# Patient Record
Sex: Male | Born: 1980 | Race: White | Hispanic: No | Marital: Single | Smoking: Current every day smoker
Health system: Southern US, Community
[De-identification: ages and names within clinical notes are randomized; demographics above are authoritative.]

---

## 2015-02-04 ENCOUNTER — Emergency Department: Payer: Self-pay | Admitting: Internal Medicine

## 2015-02-14 ENCOUNTER — Ambulatory Visit: Payer: Self-pay | Admitting: Urology

## 2015-02-16 ENCOUNTER — Ambulatory Visit: Payer: Self-pay | Admitting: Urology

## 2015-03-02 ENCOUNTER — Ambulatory Visit: Payer: Self-pay

## 2015-03-17 ENCOUNTER — Ambulatory Visit
Admit: 2015-03-17 | Disposition: A | Discharge status: Home / Self Care | Payer: Self-pay | Attending: Urology | Admitting: Urology

## 2015-04-14 ENCOUNTER — Other Ambulatory Visit: Payer: Self-pay | Admitting: Urology

## 2015-04-14 DIAGNOSIS — N1330 Unspecified hydronephrosis: Secondary | ICD-10-CM

## 2015-04-19 ENCOUNTER — Ambulatory Visit: Payer: Self-pay

## 2015-05-02 ENCOUNTER — Ambulatory Visit: Payer: BLUE CROSS/BLUE SHIELD

## 2015-05-09 ENCOUNTER — Ambulatory Visit: Payer: BLUE CROSS/BLUE SHIELD

## 2015-08-16 IMAGING — CT CT ABD-PELV W/O CM
2 of 4 series · 17 of 46 positions shown, 19 images · non-contrast
Comparison: None.

CLINICAL DATA: Right flank pain for 1 day.

EXAM:
CT ABDOMEN AND PELVIS WITHOUT CONTRAST
TECHNIQUE: Multidetector CT imaging of the abdomen and pelvis was performed
following the standard protocol without IV contrast.

[Series 2: stone standard full · axial · 0.75mm/px · z∈[-252,+188]mm · 14 of 96 slices shown, 16 images]
[im 4/96  soft-tissue]
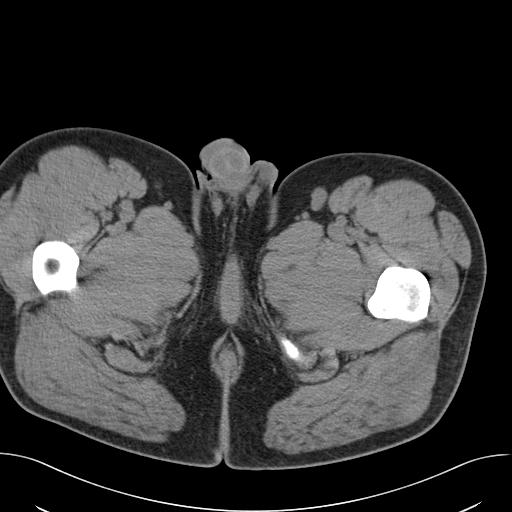
[im 4/96  bone]
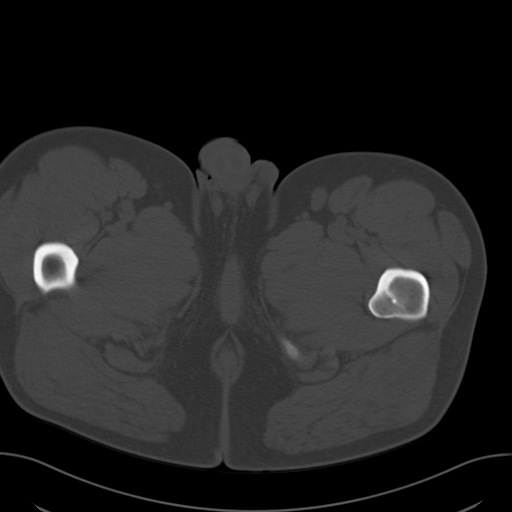
[im 12/96  soft-tissue]
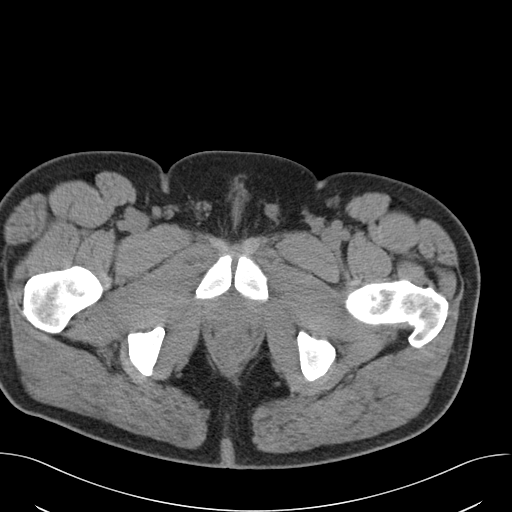
[im 20/96  soft-tissue]
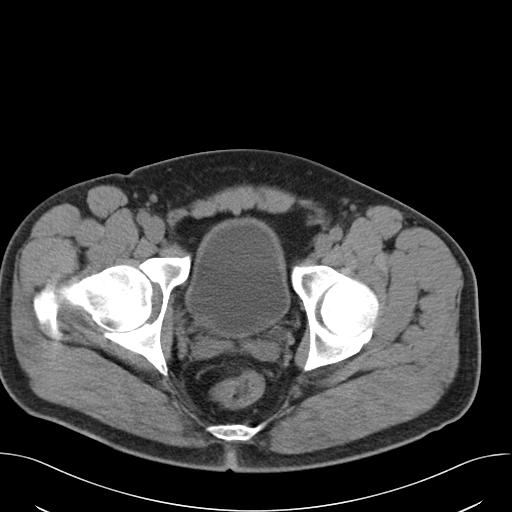
[im 24/96  soft-tissue]
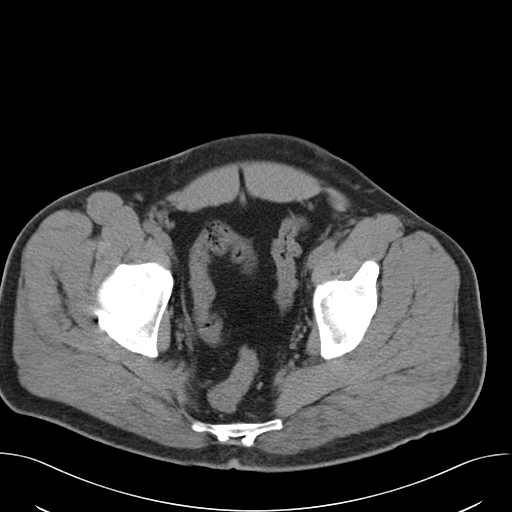
[im 32/96  soft-tissue]
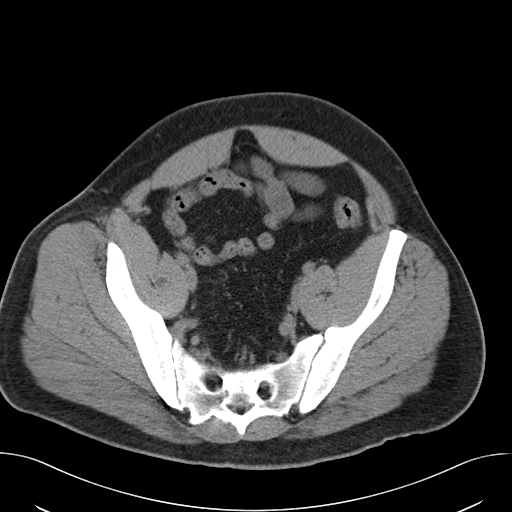
[im 40/96  soft-tissue]
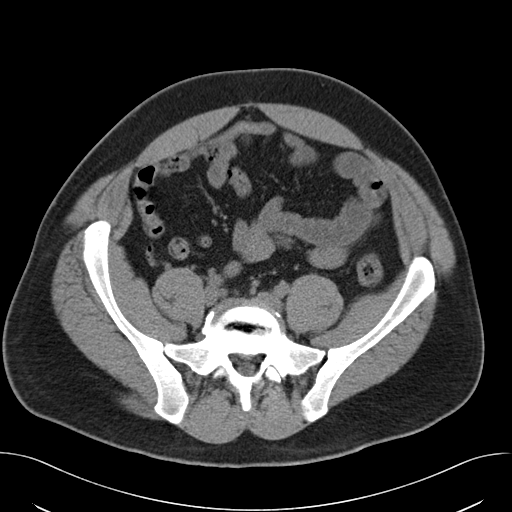
[im 44/96  soft-tissue]
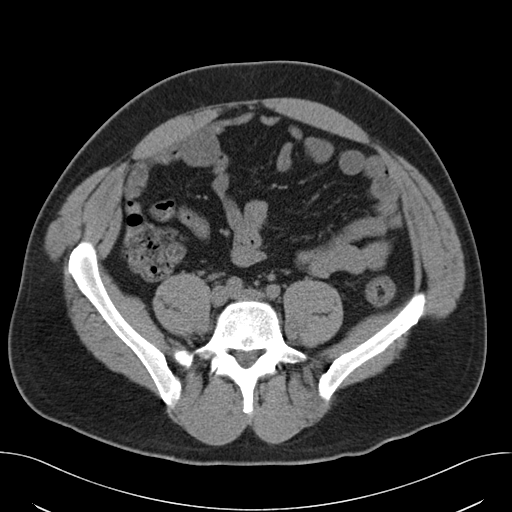
[im 52/96  soft-tissue]
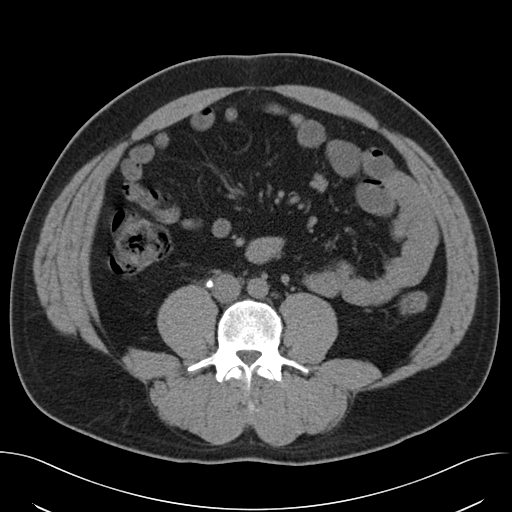
[im 56/96  soft-tissue]
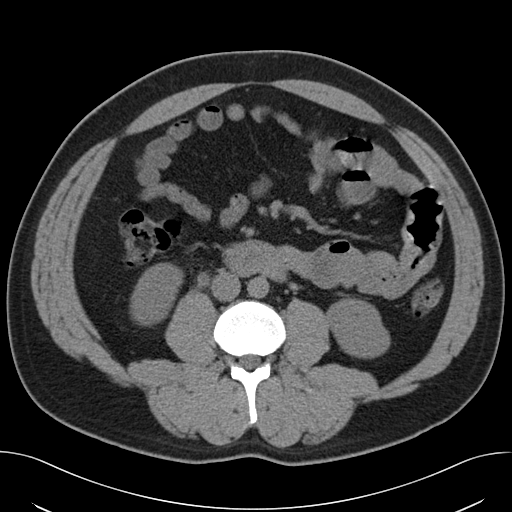
[im 56/96  bone]
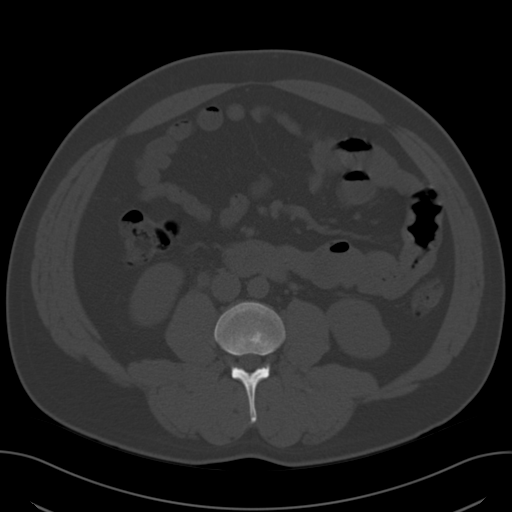
[im 64/96  soft-tissue]
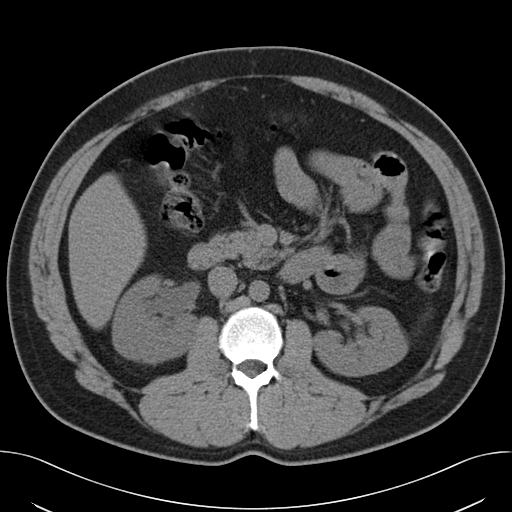
[im 72/96  soft-tissue]
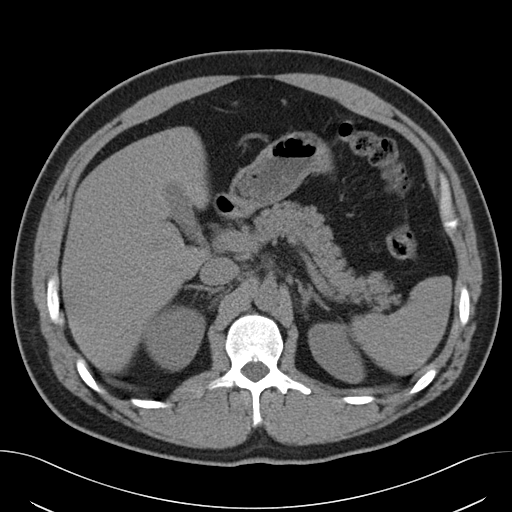
[im 76/96  soft-tissue]
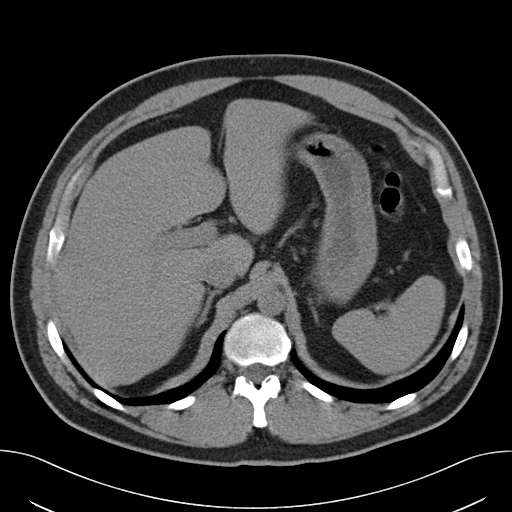
[im 84/96  soft-tissue]
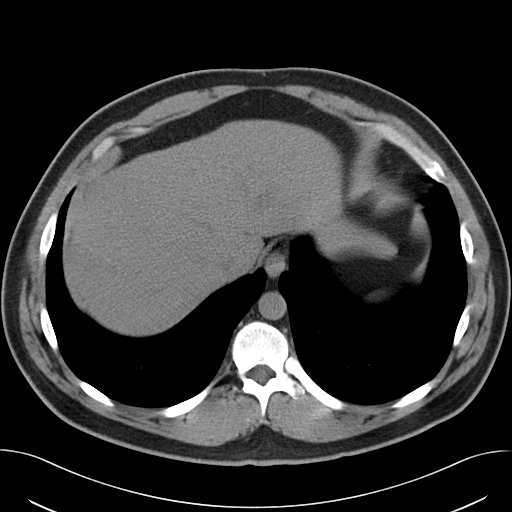
[im 92/96  soft-tissue]
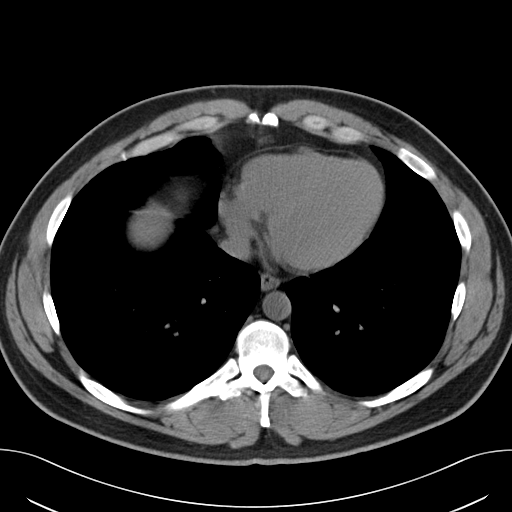

[Series 5: cor stone standard full · coronal · 0.76mm/px · 3 of 150 slices shown]
[im 50/150  soft-tissue]
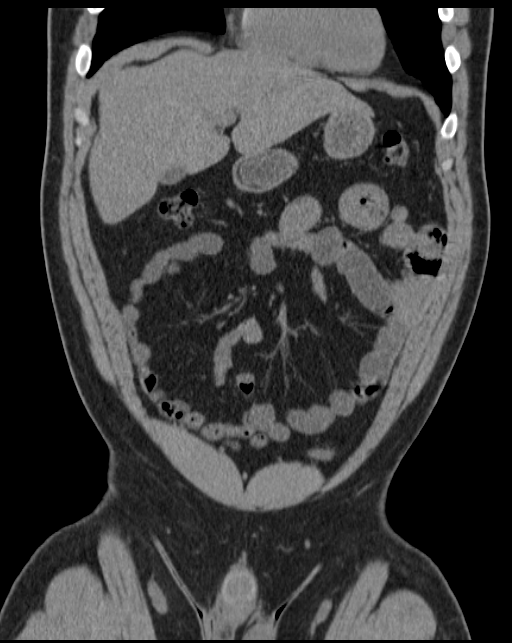
[im 67/150  soft-tissue]
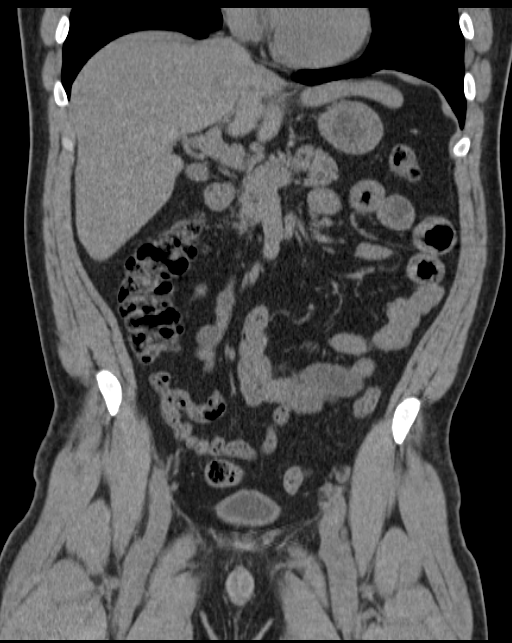
[im 83/150  soft-tissue]
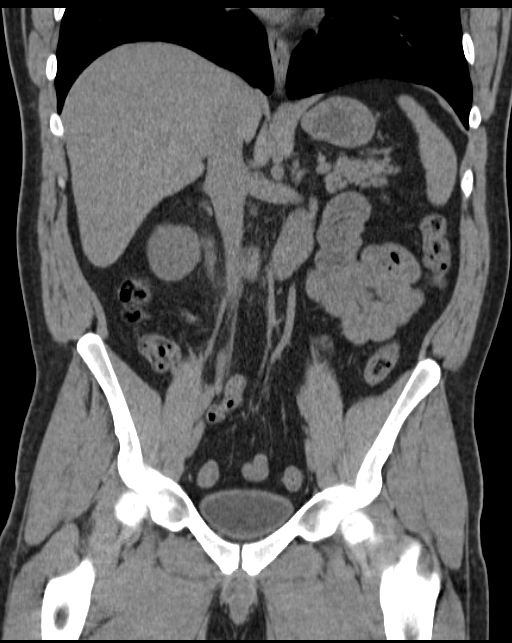

[17 of 46 positions shown; findings below may reference images not displayed]

FINDINGS: 7 mm proximal right ureteral calculus is associated with mild right
hydronephrosis. No nephrolithiasis. No left hydronephrosis.

Liver, gallbladder, spleen, pancreas, adrenal glands are within
normal limits.

Normal appendix.  Unremarkable sigmoid colon.

Bladder and prostate are unremarkable.

No free-fluid.  No obvious retroperitoneal adenopathy

Moderate L5-S1 degenerative disc disease.
IMPRESSION: Proximal 7 mm right ureteral calculus is associated with right
hydronephrosis.

## 2015-08-18 ENCOUNTER — Emergency Department
Admission: EM | Admit: 2015-08-18 | Discharge: 2015-08-18 | Disposition: A | Discharge status: Home / Self Care | Payer: BLUE CROSS/BLUE SHIELD | Attending: Emergency Medicine | Admitting: Emergency Medicine

## 2015-08-18 ENCOUNTER — Encounter: Payer: Self-pay | Admitting: Emergency Medicine

## 2015-08-18 DIAGNOSIS — K0889 Other specified disorders of teeth and supporting structures: Secondary | ICD-10-CM

## 2015-08-18 DIAGNOSIS — K088 Other specified disorders of teeth and supporting structures: Secondary | ICD-10-CM | POA: Insufficient documentation

## 2015-08-18 DIAGNOSIS — K08409 Partial loss of teeth, unspecified cause, unspecified class: Secondary | ICD-10-CM | POA: Insufficient documentation

## 2015-08-18 DIAGNOSIS — Z720 Tobacco use: Secondary | ICD-10-CM | POA: Insufficient documentation

## 2015-08-18 MED ORDER — IBUPROFEN 800 MG PO TABS: 800.0000 mg | ORAL_TABLET | Freq: Three times a day (TID) | ORAL | Status: DC

## 2015-08-18 MED ORDER — AMOXICILLIN 500 MG PO CAPS: 500.0000 mg | ORAL_CAPSULE | Freq: Three times a day (TID) | ORAL | Status: DC

## 2015-08-18 NOTE — ED Notes (Signed)
C/o toothache.  No resp distress 

## 2015-08-18 NOTE — Discharge Instructions (Signed)
Dental Pain °Toothache is pain in or around a tooth. It may get worse with chewing or with cold or heat.  °HOME CARE °· Your dentist may use a numbing medicine during treatment. If so, you may need to avoid eating until the medicine wears off. Ask your dentist about this. °· Only take medicine as told by your dentist or doctor. °· Avoid chewing food near the painful tooth until after all treatment is done. Ask your dentist about this. °GET HELP RIGHT AWAY IF:  °· The problem gets worse or new problems appear. °· You have a fever. °· There is redness and puffiness (swelling) of the face, jaw, or neck. °· You cannot open your mouth. °· There is pain in the jaw. °· There is very bad pain that is not helped by medicine. °MAKE SURE YOU:  °· Understand these instructions. °· Will watch your condition. °· Will get help right away if you are not doing well or get worse. °Document Released: 05/13/2008 Document Revised: 02/17/2012 Document Reviewed: 05/13/2008 °ExitCare® Patient Information ©2015 [employer]. This information is not intended to replace advice given to you by your health care provider. Make sure you discuss any questions you have with your health care provider. °OPTIONS FOR DENTAL FOLLOW UP CARE ° °[place] Department of Health and Human Services - Local Safety Net Dental Clinics °http://www.ncdhhs.gov/dph/oralhealth/services/safetynetclinics.htm °  °[facility] (336-562-3123) ° °[facility] [place] (919-933-9087) ° °[facility] (919-663-1744 ext 237) ° °[facility] (336-570-6415) ° °[facility] (919-968-2025) °This clinic caters to the indigent population and is on a lottery system. °Location: °[facility], [address], [place] °Clinic Hours: °Wednesdays from 6pm - 9pm, patients seen by a lottery system. °For dates, call or go to www.med.[facility].edu/[facility]/patients/Dental-[facility] °Services: °Cleanings, fillings and simple  extractions. °Payment Options: °DENTAL WORK IS FREE OF CHARGE. Bring proof of income or support. °Best way to get seen: °Arrive at 5:15 pm - this is a lottery, NOT first come/first serve, so arriving earlier will not increase your chances of being seen. °  °  °[facility] °919-537-3737 °Select option 1 for emergencies °  °Location: °[facility], [address], [place] °Clinic Hours: °No walk-ins accepted - call the day before to schedule an appointment. °Check in times are 9:30 am and 1:30 pm. °Services: °Simple extractions, temporary fillings, pulpectomy/pulp debridement, uncomplicated abscess drainage. °Payment Options: °PAYMENT IS DUE AT THE TIME OF SERVICE.  Fee is usually $100-200, additional surgical procedures (e.g. abscess drainage) may be extra. °Cash, checks, Visa/MasterCard accepted.  Can file Medicaid if patient is covered for dental - patient should call case worker to check. °No discount for [facility] patients. °Best way to get seen: °MUST call the day before and get onto the schedule. Can usually be seen the next 1-2 days. No walk-ins accepted. °  °  °[facility] °[phone] °  °Location: °[facility], [address], [place] °Clinic Hours: °M, W, Th, F 8am or 1:30pm, Tues 9a or 1:30 - first come/first served. °Services: °Simple extractions, temporary fillings, uncomplicated abscess drainage.  You do not need to be an [place] resident. °Payment Options: °PAYMENT IS DUE AT THE TIME OF SERVICE. °Dental insurance, otherwise sliding scale - bring proof of income or support. °Depending on income and treatment needed, cost is usually $50-200. °Best way to get seen: °Arrive early as it is first come/first served. °  °  °  [facility] °[phone] °  °Location: °[address] °Clinic Hours: °Mon-Thu 8a-5p °Services: °Most basic dental services including  extractions and fillings. °Payment Options: °PAYMENT IS DUE AT THE TIME OF SERVICE. °Sliding scale, up to 50% off - bring proof if income or support. °Medicaid with dental option accepted. °Best way to get seen: °Call to schedule an appointment, can usually be seen within 2 weeks OR they will try to see walk-ins - show up at 8a or 2p (you may have to wait). °  °  °[facility] °919-245-2435 °[place] RESIDENTS ONLY °  °Location: °[facility], [address], [place], [place] [address] °Clinic Hours: By appointment only. °Monday - Thursday 8am-5pm, Friday 8am-12pm °Services: Cleanings, fillings, extractions. °Payment Options: °PAYMENT IS DUE AT THE TIME OF SERVICE. °Cash, Visa or MasterCard. Sliding scale - $30 minimum per service. °Best way to get seen: °Come in to office, complete packet and make an appointment - need proof of income °or support monies for each household member and proof of [place] residence. °Usually takes about a month to get in. °  °  °[facility] °[phone] °  °Location: °[address] [place] °Clinic Hours: Walk-in Urgent Care Dental Services are offered Monday-Friday mornings only. °The numbers of emergencies accepted daily is limited to the number of °providers available. °Maximum 15 - Mondays, Wednesdays & Thursdays °Maximum 10 - Tuesdays & Fridays °Services: °You do not need to be a [place] resident to be seen for a dental emergency. °Emergencies are defined as pain, swelling, abnormal bleeding, or dental trauma. Walkins will receive x-rays if needed. °NOTE: Dental cleaning is not an emergency. °Payment Options: °PAYMENT IS DUE AT THE TIME OF SERVICE. °Minimum co-pay is $40.00 for uninsured patients. °Minimum co-pay is $3.00 for Medicaid with dental coverage. °Dental Insurance is accepted and must be presented at time of visit. °Medicare does not cover dental. °Forms of payment: Cash, credit card,  checks. °Best way to get seen: °If not previously registered with the clinic, walk-in dental registration begins at 7:15 am and is on a first come/first serve basis. °If previously registered with the clinic, call to make an appointment. °  °  °[facility] °[phone] °[place] RESIDENTS ONLY °  °Location: °[address], [place],  °Clinic Hours: °Mon-Thu 10a-2p °Services: Extractions only! °Payment Options: °FREE (donations accepted) - bring proof of income or support °Best way to get seen: °Call and schedule an appointment OR come at 8am on the 1st Monday of every month (except for holidays) when it is first come/first served. °  °  °[facility] °[phone] °  °Location: °[address], [place] °Clinic Hours: °Friday mornings °Services, Payment Options, Best way to get seen: °Call for info ° °

## 2015-08-18 NOTE — ED Provider Notes (Signed)
[facility] Emergency Department Provider Note ____________________________________________  Time seen: Approximately 8:26 AM  I have reviewed the triage vital signs and the nursing notes.   HISTORY  Chief Complaint Dental Pain   HPI Jonathon Walters is a 34 y.o. male is here with complaint toothache. He states he is to see his been hurting for "some time" but he has not made a dentist appointment because he is waiting for his to insurance to "kick in". His wife was seen here in the emergency room last month for a toothache and they have the list of dental clinics but neither has made an appointment with the dentist. Patient states still pain is constant and currently at 10 over 10 on the pain scale. Patient continues to smoke daily. He states his smoking does make it worse.   History reviewed. No pertinent past medical history.  There are no active problems to display for this patient.   History reviewed. No pertinent past surgical history.  Current Outpatient Rx  Name  Route  Sig  Dispense  Refill  . amoxicillin (AMOXIL) 500 MG capsule   Oral   Take 1 capsule (500 mg total) by mouth 3 (three) times daily.   30 capsule   0   . ibuprofen (ADVIL,MOTRIN) 800 MG tablet   Oral   Take 1 tablet (800 mg total) by mouth 3 (three) times daily.   30 tablet   0     Allergies Review of patient's allergies indicates no known allergies.  History reviewed. No pertinent family history.  Social History Social History  Substance Use Topics  . Smoking status: Current Every Day Smoker  . Smokeless tobacco: None  . Alcohol Use: None    Review of Systems Constitutional: No fever/chills ENT: No sore throat. Cardiovascular: Denies chest pain. Respiratory: Denies shortness of breath. Gastrointestinal: No abdominal pain.  No nausea, no vomiting.  Musculoskeletal: Negative for back pain. Skin: Negative for rash. Neurological: Negative for headaches, focal  weakness or numbness.  10-point ROS otherwise negative.  ____________________________________________   PHYSICAL EXAM:  VITAL SIGNS: ED Triage Vitals  Enc Vitals Group     BP 08/18/15 0800 155/98 mmHg     Pulse Rate 08/18/15 0800 92     Resp 08/18/15 0800 18     Temp 08/18/15 0800 97.8 F (36.6 C)     Temp Source 08/18/15 0800 Oral     SpO2 08/18/15 0800 98 %     Weight 08/18/15 0800 225 lb (102.059 kg)     Height 08/18/15 0800  (1.727 m)     Head Cir --      Peak Flow --      Pain Score 08/18/15 0801 10     Pain Loc --      Pain Edu? --      Excl. in GC? --     Constitutional: Alert and oriented. Well appearing and in no acute distress. Eyes: Conjunctivae are normal. PERRL. EOMI. Head: Atraumatic. Nose: No congestion/rhinnorhea. Mouth/Throat: Mucous membranes are moist.  Oropharynx non-erythematous. All teeth are with poor dental hygiene. Tooth causing pain at this time is left lower #26, and gum surrounding is slightly swollen tooth #25 has been extracted. Neck: No stridor.   Hematological/Lymphatic/Immunilogical: No cervical lymphadenopathy. Cardiovascular: Normal rate, regular rhythm. Grossly normal heart sounds.  Good peripheral circulation. Respiratory: Normal respiratory effort.  No retractions. Lungs CTAB. Gastrointestinal: Soft and nontender. No distention. Musculoskeletal: No lower extremity tenderness nor edema.  No joint  effusions. Neurologic:  Normal speech and language. No gross focal neurologic deficits are appreciated. No gait instability. Skin:  Skin is warm, dry and intact. No rash noted. Psychiatric: Mood and affect are normal. Speech and behavior are normal.  ____________________________________________   LABS (all labs ordered are listed, but only abnormal results are displayed)  Labs Reviewed - No data to display  PROCEDURES  Procedure(s) performed: None  Critical Care performed:  No  ____________________________________________   INITIAL IMPRESSION / ASSESSMENT AND PLAN / ED COURSE  Pertinent labs & imaging results that were available during my care of the patient were reviewed by me and considered in my medical decision making (see chart for details).  Patient was started on amoxicillin and given ibuprofen as needed for pain. He was given the list of dental clinics in this area to be seen. He was encouraged to call and make an appointment. ____________________________________________   FINAL CLINICAL IMPRESSION(S) / ED DIAGNOSES  Final diagnoses:  Pain, dental      Tommi Rumps, PA-C 08/18/15 0845  Sharyn Creamer, MD 08/19/15 [phone]

## 2016-01-06 ENCOUNTER — Emergency Department
Admission: EM | Admit: 2016-01-06 | Discharge: 2016-01-06 | Disposition: A | Discharge status: Home / Self Care | Payer: BLUE CROSS/BLUE SHIELD | Attending: Emergency Medicine | Admitting: Emergency Medicine

## 2016-01-06 ENCOUNTER — Encounter: Payer: Self-pay | Admitting: Emergency Medicine

## 2016-01-06 DIAGNOSIS — K032 Erosion of teeth: Secondary | ICD-10-CM | POA: Insufficient documentation

## 2016-01-06 DIAGNOSIS — Z79899 Other long term (current) drug therapy: Secondary | ICD-10-CM | POA: Insufficient documentation

## 2016-01-06 DIAGNOSIS — K029 Dental caries, unspecified: Secondary | ICD-10-CM | POA: Insufficient documentation

## 2016-01-06 DIAGNOSIS — K047 Periapical abscess without sinus: Secondary | ICD-10-CM | POA: Insufficient documentation

## 2016-01-06 DIAGNOSIS — F17210 Nicotine dependence, cigarettes, uncomplicated: Secondary | ICD-10-CM | POA: Insufficient documentation

## 2016-01-06 MED ORDER — MAGIC MOUTHWASH W/LIDOCAINE: 5.0000 mL | Freq: Four times a day (QID) | ORAL | Status: DC

## 2016-01-06 MED ORDER — AMOXICILLIN 875 MG PO TABS: 875.0000 mg | ORAL_TABLET | Freq: Two times a day (BID) | ORAL | Status: DC

## 2016-01-06 MED ORDER — TRAMADOL HCL 50 MG PO TABS: 50.0000 mg | ORAL_TABLET | Freq: Four times a day (QID) | ORAL | Status: AC | PRN

## 2016-01-06 NOTE — ED Notes (Signed)
Lower front toothache since yesterday, history toothache same spot.

## 2016-01-06 NOTE — Discharge Instructions (Signed)
Dental Abscess °A dental abscess is a collection of pus in or around a tooth. °CAUSES °This condition is caused by a bacterial infection around the root of the tooth that involves the inner part of the tooth (pulp). It may result from: °· Severe tooth decay. °· Trauma to the tooth that allows bacteria to enter into the pulp, such as a broken or chipped tooth. °· Severe gum disease around a tooth. °SYMPTOMS °Symptoms of this condition include: °· Severe pain in and around the infected tooth. °· Swelling and redness around the infected tooth, in the mouth, or in the face. °· Tenderness. °· Pus drainage. °· Bad breath. °· Bitter taste in the mouth. °· Difficulty swallowing. °· Difficulty opening the mouth. °· Nausea. °· Vomiting. °· Chills. °· Swollen neck glands. °· Fever. °DIAGNOSIS °This condition is diagnosed with examination of the infected tooth. During the exam, your dentist may tap on the infected tooth. Your dentist will also ask about your medical and dental history and may order X-rays. °TREATMENT °This condition is treated by eliminating the infection. This may be done with: °· Antibiotic medicine. °· A root canal. This may be performed to save the tooth. °· Pulling (extracting) the tooth. This may also involve draining the abscess. This is done if the tooth cannot be saved. °HOME CARE INSTRUCTIONS °· Take medicines only as directed by your dentist. °· If you were prescribed antibiotic medicine, finish all of it even if you start to feel better. °· Rinse your mouth (gargle) often with salt water to relieve pain or swelling. °· Do not drive or operate heavy machinery while taking pain medicine. °· Do not apply heat to the outside of your mouth. °· Keep all follow-up visits as directed by your dentist. This is important. °SEEK MEDICAL CARE IF: °· Your pain is worse and is not helped by medicine. °SEEK IMMEDIATE MEDICAL CARE IF: °· You have a fever or chills. °· Your symptoms suddenly get worse. °· You have a  very bad headache. °· You have problems breathing or swallowing. °· You have trouble opening your mouth. °· You have swelling in your neck or around your eye. °  °This information is not intended to replace advice given to you by your health care provider. Make sure you discuss any questions you have with your health care provider. °  °Document Released: 11/25/2005 Document Revised: 04/11/2015 Document Reviewed: 11/22/2014 °Elsevier Interactive Patient Education ©2016 Else[employer] Inc. ° °Dental Care and Dentist Visits °Dental care supports good overall health. Regular dental visits can also help you avoid dental pain, bleeding, infection, and other more serious health problems in the future. It is important to keep the mouth healthy because diseases in the teeth, gums, and other oral tissues can spread to other areas of the body. Some problems, such as diabetes, heart disease, and pre-term labor have been associated with poor oral health.  °See your dentist every 6 months. If you experience emergency problems such as a toothache or broken tooth, go to the dentist right away. If you see your dentist regularly, you may catch problems early. It is easier to be treated for problems in the early stages.  °WHAT TO EXPECT AT A DENTIST VISIT  °Your dentist will look for many common oral health problems and recommend proper treatment. At your regular dental visit, you can expect: °· Gentle cleaning of the teeth and gums. This includes scraping and polishing. This helps to remove the sticky substance around the teeth and gums (  plaque). Plaque forms in the mouth shortly after eating. Over time, plaque hardens on the teeth as tartar. If tartar is not removed regularly, it can cause problems. Cleaning also helps remove stains. °· Periodic X-rays. These pictures of the teeth and supporting bone will help your dentist assess the health of your teeth. °· Periodic fluoride treatments. Fluoride is a natural mineral shown to help  strengthen teeth. Fluoride treatment involves applying a fluoride gel or varnish to the teeth. It is most commonly done in children. °· Examination of the mouth, tongue, jaws, teeth, and gums to look for any oral health problems, such as: °¨ Cavities (dental caries). This is decay on the tooth caused by plaque, sugar, and acid in the mouth. It is best to catch a cavity when it is small. °¨ Inflammation of the gums caused by plaque buildup (gingivitis). °¨ Problems with the mouth or malformed or misaligned teeth. °¨ Oral cancer or other diseases of the soft tissues or jaws.  °KEEP YOUR TEETH AND GUMS HEALTHY °For healthy teeth and gums, follow these general guidelines as well as your dentist's specific advice: °· Have your teeth professionally cleaned at the dentist every 6 months. °· Brush twice daily with a fluoride toothpaste. °· Floss your teeth daily.  °· Ask your dentist if you need fluoride supplements, treatments, or fluoride toothpaste. °· Eat a healthy diet. Reduce foods and drinks with added sugar. °· Avoid smoking. °TREATMENT FOR ORAL HEALTH PROBLEMS °If you have oral health problems, treatment varies depending on the conditions present in your teeth and gums. °· Your caregiver will most likely recommend good oral hygiene at each visit. °· For cavities, gingivitis, or other oral health disease, your caregiver will perform a procedure to treat the problem. This is typically done at a separate appointment. Sometimes your caregiver will refer you to another dental specialist for specific tooth problems or for surgery. °SEEK IMMEDIATE DENTAL CARE IF: °· You have pain, bleeding, or soreness in the gum, tooth, jaw, or mouth area. °· A permanent tooth becomes loose or separated from the gum socket. °· You experience a blow or injury to the mouth or jaw area. °  °This information is not intended to replace advice given to you by your health care provider. Make sure you discuss any questions you have with your  health care provider. °  °Document Released: 08/07/2011 Document Revised: 02/17/2012 Document Reviewed: 08/07/2011 °Elsevier Interactive Patient Education ©2016 Elsevier Inc. ° °

## 2016-01-06 NOTE — ED Notes (Signed)
Pt to ed with c/o dental pain in lower front of mouth.  Pt states it has been causing pain for several month but usually motrin or tylenol would relieve the pain.  States now pain is sever and unrelieved by meds. Small amount of swelling noted to pt face.

## 2016-01-06 NOTE — ED Provider Notes (Signed)
[facility] Emergency Department Provider Note  ____________________________________________  Time seen: Approximately 7:25 AM  I have reviewed the triage vital signs and the nursing notes.   HISTORY  Chief Complaint Dental Pain    HPI Jonathon Walters is a 36 y.o. male presents emergency department complaining of dental/gum pain. Patient states that he has had poor dentition for "a while". He states that he has intermittent left lower front dental pain. He has been taking BC powder or Tylenol for same. He states that this time is gum starting to swell.He denies any fevers or chills, difficulty breathing or swallowing, chest pain, shortness of breath, nausea or vomiting. Patient does not have a dentist at this time.   History reviewed. No pertinent past medical history.  There are no active problems to display for this patient.   History reviewed. No pertinent past surgical history.  Current Outpatient Rx  Name  Route  Sig  Dispense  Refill  . amoxicillin (AMOXIL) 875 MG tablet   Oral   Take 1 tablet (875 mg total) by mouth 2 (two) times daily.   14 tablet   0   . ibuprofen (ADVIL,MOTRIN) 800 MG tablet   Oral   Take 1 tablet (800 mg total) by mouth 3 (three) times daily.   30 tablet   0   . magic mouthwash w/lidocaine SOLN   Oral   Take 5 mLs by mouth 4 (four) times daily.   240 mL   0     Dispense in a 1/1/1/1 ratio. Use lidocaine, diphen ...   . traMADol (ULTRAM) 50 MG tablet   Oral   Take 1 tablet (50 mg total) by mouth every 6 (six) hours as needed.   10 tablet   0     Allergies Review of patient's allergies indicates no known allergies.  No family history on file.  Social History Social History  Substance Use Topics  . Smoking status: Current Every Day Smoker -- 1.00 packs/day    Types: Cigarettes  . Smokeless tobacco: None  . Alcohol Use: Yes     Review of Systems  Constitutional: No fever/chills Eyes: No visual  changes. No discharge ENT: No sore throat. Endorses left front dental pain. Cardiovascular: no chest pain. Respiratory: no cough. No SOB. Gastrointestinal: No abdominal pain.  No nausea, no vomiting.  No diarrhea.  No constipation. Genitourinary: Negative for dysuria. No hematuria Musculoskeletal: Negative for back pain. Skin: Negative for rash. Neurological: Negative for headaches, focal weakness or numbness. 10-point ROS otherwise negative.  ____________________________________________   PHYSICAL EXAM:  VITAL SIGNS: ED Triage Vitals  Enc Vitals Group     BP 01/06/16 0714 154/92 mmHg     Pulse Rate 01/06/16 0714 74     Resp 01/06/16 0714 18     Temp 01/06/16 0714 97.4 F (36.3 C)     Temp Source 01/06/16 0714 Oral     SpO2 01/06/16 0714 99 %     Weight 01/06/16 0714 225 lb (102.059 kg)     Height 01/06/16 0714  (1.778 m)     Head Cir --      Peak Flow --      Pain Score 01/06/16 0715 10     Pain Loc --      Pain Edu? --      Excl. in GC? --      Constitutional: Alert and oriented. Well appearing and in no acute distress. Eyes: Conjunctivae are normal. PERRL. EOMI. Head:  Atraumatic. ENT:      Ears:       Nose: No congestion/rhinnorhea.      Mouth/Throat: Mucous membranes are moist. Poor dentition with multiple caries and erosions noted throughout mouth. Patient is missing the front lower two teeth. Patient has significant caries and erosions to surrounding dentition. There is erythema and edema surrounding the 23rd and 22nd tooth. No drainage noted. No fluctuance to palpation with tongue depressor. Neck: No stridor.   Hematological/Lymphatic/Immunilogical: No cervical lymphadenopathy. Cardiovascular: Normal rate, regular rhythm. Normal S1 and S2.  Good peripheral circulation. Respiratory: Normal respiratory effort without tachypnea or retractions. Lungs CTAB. Gastrointestinal: Soft and nontender. No distention. No CVA tenderness. Musculoskeletal: No lower  extremity tenderness nor edema.  No joint effusions. Neurologic:  Normal speech and language. No gross focal neurologic deficits are appreciated.  Skin:  Skin is warm, dry and intact. No rash noted. Psychiatric: Mood and affect are normal. Speech and behavior are normal. Patient exhibits appropriate insight and judgement.   ____________________________________________   LABS (all labs ordered are listed, but only abnormal results are displayed)  Labs Reviewed - No data to display ____________________________________________  EKG   ____________________________________________  RADIOLOGY   No results found.  ____________________________________________    PROCEDURES  Procedure(s) performed:       Medications - No data to display   ____________________________________________   INITIAL IMPRESSION / ASSESSMENT AND PLAN / ED COURSE  Pertinent labs & imaging results that were available during my care of the patient were reviewed by me and considered in my medical decision making (see chart for details).  Patient's diagnosis is consistent with dental abscess. Patient will be discharged home with prescriptions for antibiotics, mouthwash, very limited pain medication. Patient is to follow up with dentist if symptoms persist past this treatment course. Patient is given ED precautions to return to the ED for any worsening or new symptoms.     ____________________________________________  FINAL CLINICAL IMPRESSION(S) / ED DIAGNOSES  Final diagnoses:  Dental abscess      NEW MEDICATIONS STARTED DURING THIS VISIT:  New Prescriptions   AMOXICILLIN (AMOXIL) 875 MG TABLET    Take 1 tablet (875 mg total) by mouth 2 (two) times daily.   MAGIC MOUTHWASH W/LIDOCAINE SOLN    Take 5 mLs by mouth 4 (four) times daily.   TRAMADOL (ULTRAM) 50 MG TABLET    Take 1 tablet (50 mg total) by mouth every 6 (six) hours as needed.       Delorise Royals Nakoma Gotwalt, PA-C 01/06/16  1610  Governor Rooks, MD 01/06/16 [phone]

## 2016-02-02 ENCOUNTER — Encounter: Payer: Self-pay | Admitting: Emergency Medicine

## 2016-02-02 ENCOUNTER — Emergency Department
Admission: EM | Admit: 2016-02-02 | Discharge: 2016-02-02 | Disposition: A | Discharge status: Home / Self Care | Payer: BLUE CROSS/BLUE SHIELD | Attending: Emergency Medicine | Admitting: Emergency Medicine

## 2016-02-02 DIAGNOSIS — Z791 Long term (current) use of non-steroidal anti-inflammatories (NSAID): Secondary | ICD-10-CM | POA: Insufficient documentation

## 2016-02-02 DIAGNOSIS — F17210 Nicotine dependence, cigarettes, uncomplicated: Secondary | ICD-10-CM | POA: Insufficient documentation

## 2016-02-02 DIAGNOSIS — Z79899 Other long term (current) drug therapy: Secondary | ICD-10-CM | POA: Insufficient documentation

## 2016-02-02 DIAGNOSIS — K0510 Chronic gingivitis, plaque induced: Secondary | ICD-10-CM | POA: Insufficient documentation

## 2016-02-02 MED ORDER — TRAMADOL HCL 50 MG PO TABS
50.0000 mg | ORAL_TABLET | Freq: Once | ORAL | Status: AC
  Administered 2016-02-02: 50 mg via ORAL
  Filled 2016-02-02: qty 1

## 2016-02-02 MED ORDER — AMOXICILLIN 500 MG PO CAPS
500.0000 mg | ORAL_CAPSULE | Freq: Once | ORAL | Status: AC
  Administered 2016-02-02: 500 mg via ORAL
  Filled 2016-02-02: qty 1

## 2016-02-02 MED ORDER — AMOXICILLIN 500 MG PO TABS: 500.0000 mg | ORAL_TABLET | Freq: Three times a day (TID) | ORAL | Status: DC

## 2016-02-02 MED ORDER — TRAMADOL HCL 50 MG PO TABS: 50.0000 mg | ORAL_TABLET | Freq: Four times a day (QID) | ORAL | Status: AC | PRN

## 2016-02-02 NOTE — ED Provider Notes (Signed)
[facility] Emergency Department Provider Note  ____________________________________________  Time seen: Approximately 7:30 PM  I have reviewed the triage vital signs and the nursing notes.   HISTORY  Chief Complaint Dental Pain    HPI Jonathon Walters is a 35 y.o. male who presents with 1 day history of pain to the lower anterior gum. This area is without teeth. Areas red and tender without swelling. He had similar pain approximately 1 month ago and was seen here in the emergency room. Symptoms resolved with antibiotics. He has not been able to see a dentist for follow-up. No fevers or chills. Cough. No congestion. Denies any other dental pain.   History reviewed. No pertinent past medical history.  There are no active problems to display for this patient.   History reviewed. No pertinent past surgical history.  Current Outpatient Rx  Name  Route  Sig  Dispense  Refill  . amoxicillin (AMOXIL) 500 MG tablet   Oral   Take 1 tablet (500 mg total) by mouth 3 (three) times daily.   30 tablet   0   . amoxicillin (AMOXIL) 875 MG tablet   Oral   Take 1 tablet (875 mg total) by mouth 2 (two) times daily.   14 tablet   0   . ibuprofen (ADVIL,MOTRIN) 800 MG tablet   Oral   Take 1 tablet (800 mg total) by mouth 3 (three) times daily.   30 tablet   0   . magic mouthwash w/lidocaine SOLN   Oral   Take 5 mLs by mouth 4 (four) times daily.   240 mL   0     Dispense in a 1/1/1/1 ratio. Use lidocaine, diphen ...   . traMADol (ULTRAM) 50 MG tablet   Oral   Take 1 tablet (50 mg total) by mouth every 6 (six) hours as needed.   10 tablet   0   . traMADol (ULTRAM) 50 MG tablet   Oral   Take 1 tablet (50 mg total) by mouth every 6 (six) hours as needed.   20 tablet   0     Allergies Review of patient's allergies indicates no known allergies.  No family history on file.  Social History Social History  Substance Use Topics  . Smoking status:  Current Every Day Smoker -- 1.00 packs/day    Types: Cigarettes  . Smokeless tobacco: None  . Alcohol Use: Yes    Review of Systems Constitutional: No fever/chills Eyes: No visual changes. ENT: No sore throat. Cardiovascular: Denies chest pain. Respiratory: Denies shortness of breath. Gastrointestinal: No abdominal pain.  No nausea, no vomiting.  No diarrhea.  No constipation. Genitourinary: Negative for dysuria. Musculoskeletal: Negative for back pain. Skin: Negative for rash. Neurological: Negative for headaches, focal weakness or numbness. 10-point ROS otherwise negative.  ____________________________________________   PHYSICAL EXAM:  VITAL SIGNS: ED Triage Vitals  Enc Vitals Group     BP 02/02/16 1811 141/96 mmHg     Pulse Rate 02/02/16 1811 78     Resp 02/02/16 1811 18     Temp 02/02/16 1811 97.9 F (36.6 C)     Temp Source 02/02/16 1811 Oral     SpO2 02/02/16 1811 98 %     Weight 02/02/16 1811 225 lb (102.059 kg)     Height 02/02/16 1811  (1.778 m)     Head Cir --      Peak Flow --      Pain Score 02/02/16 1813 5  Pain Loc --      Pain Edu? --      Excl. in GC? --     Constitutional: Alert and oriented. Well appearing and in no acute distress. Eyes: Conjunctivae are normal. PERRL. EOMI. Ears:  Clear with normal landmarks. No erythema. Head: Atraumatic. Nose: No congestion/rhinnorhea. Mouth/Throat: Mucous membranes are moist.  He is pendulous of the lower front teeth. Gums are tender without clear abscess noted. Neck:  Supple.  No adenopathy.   Cardiovascular: Normal rate, regular rhythm. Grossly normal heart sounds.  Good peripheral circulation. Respiratory: Normal respiratory effort.  No retractions. Lungs CTAB. Musculoskeletal: Nml ROM of upper and lower extremity joints. Neurologic:  Normal speech and language. No gross focal neurologic deficits are appreciated. No gait instability. Skin:  Skin is warm, dry and intact. No rash  noted. Psychiatric: Mood and affect are normal. Speech and behavior are normal.  ____________________________________________   LABS (all labs ordered are listed, but only abnormal results are displayed)  Labs Reviewed - No data to display ____________________________________________  EKG   ____________________________________________  RADIOLOGY   ____________________________________________   PROCEDURES  Procedure(s) performed: None  Critical Care performed: No  ____________________________________________   INITIAL IMPRESSION / ASSESSMENT AND PLAN / ED COURSE  Pertinent labs & imaging results that were available during my care of the patient were reviewed by me and considered in my medical decision making (see chart for details).  35 year old male with 1 day history of increasing pain to the anterior lower gumline. Consistent with gingivitis. Similar symptoms approximately 1 month ago that resolved with amoxicillin. He is given another prescription for the same, along with tramadol and encouraged follow-up with a dentist. He'll return to emergency room for any worsening symptoms. ____________________________________________   FINAL CLINICAL IMPRESSION(S) / ED DIAGNOSES  Final diagnoses:  Gingivitis      Ignacia Bayley, PA-C 02/02/16 1934  Minna Antis, MD 02/02/16 2337

## 2016-02-02 NOTE — ED Notes (Signed)
C/o left lower front tooth pain.  Onset of symptoms today.  Has taken some BC powder and Ibuprofen for pain, no relief.

## 2016-02-02 NOTE — Discharge Instructions (Signed)
Gingivitis Gingivitis is a form of gum (periodontal) disease that causes redness, soreness, and swelling (inflammation) of your gums. CAUSES The most common cause of gingivitis is poor oral hygiene. A sticky substance made of bacteria, mucus, and food particles (plaque), is deposited on the exposed part of teeth. As plaque builds up, it reacts with the saliva in your mouth to form something called  tartar. Tartar is a hard deposit that becomes trapped around the base of the tooth. Plaque and tartar irritate the gums, leading to the formation of gingivitis. Other factors that increase your risk for gingivitis include:   Tobacco use.  Diabetes.  Older age.  Certain medications.  Certain viral or fungal infections.  Dry mouth.  Hormonal changes such as during pregnancy.  Poor nutrition.  Substance abuse.  Poor fitting dental restorations or appliances. SYMPTOMS You may notice inflammation of the soft tissue (gingiva) around the teeth. When these tissues become inflamed, they bleed easily, especially during flossing or brushing. The gums may also be:   Tender to the touch.  Bright red, purple red, or have a shiny appearance.  Swollen.  Wearing away from the teeth (receding), which exposes more of the tooth. Bad breath is often present. Continued infection around teeth can eventually cause cavities and loosen teeth. This may lead to eventual tooth loss. DIAGNOSIS A medical and dental history will be taken. Your mouth, teeth, and gums will be examined. Your dentist will look for soft, swollen purple-red, irritated gums. There may be deposits of plaque and tartar at the base of the teeth. Your gums will be looked at for the degree of redness, puffiness, and bleeding tendencies. Your dentist will see if any of the teeth are loose. X-rays may be taken to see if the inflammation has spread to the supporting structures of the teeth. TREATMENT The goal is to reduce and reverse the  inflammation. Proper treatment can usually reverse the symptoms of gingivitis and prevent further progression of the disease. Have your teeth cleaned. During the cleaning, all plaque and tartar will be removed. Instruction for proper home care will be given. You will need regular professional cleanings and check-ups in the future. HOME CARE INSTRUCTIONS  Brush your teeth twice a day and floss at least once per day. When flossing, it is best to floss first then brush.  Limit sugar between meals and maintain a well-balanced diet.  Even the best dental hygiene will not prevent plaque from developing. It is necessary for you to see your dentist on a regular basis for cleaning and regular checkups.  Your dentist can recommend proper oral hygiene and mouth care and suggest special toothpastes or mouth rinses.  Stop smoking. SEEK DENTAL OR MEDICAL CARE IF:  You have painful, reddened tissue around your teeth, or you have puffy swollen gums.  You have difficulty chewing.  You notice any loose or infected teeth.  You have swollen glands.  Your gums bleed easily when you brush your teeth or are very tender to the touch.   This information is not intended to replace advice given to you by your health care provider. Make sure you discuss any questions you have with your health care provider.   Document Released: 05/21/2001 Document Revised: 02/17/2012 Document Reviewed: 07/10/2015 Elsevier Interactive Patient Education 2016 [employer].   Take antibiotics and pain medicine as directed. Follow-up with the dentist when available.

## 2016-02-02 NOTE — ED Notes (Signed)
Discharge instructions reviewed with patient. Patient verbalized understanding. Patient ambulated to lobby without difficulty.   

## 2016-02-02 NOTE — ED Notes (Signed)
Pt c/o left lower tooth pain. Reports that this started today. Has been trying OTC meds without relief.

## 2017-06-22 DIAGNOSIS — K029 Dental caries, unspecified: Secondary | ICD-10-CM | POA: Insufficient documentation

## 2017-06-22 DIAGNOSIS — Z791 Long term (current) use of non-steroidal anti-inflammatories (NSAID): Secondary | ICD-10-CM | POA: Insufficient documentation

## 2017-06-22 DIAGNOSIS — F17210 Nicotine dependence, cigarettes, uncomplicated: Secondary | ICD-10-CM | POA: Insufficient documentation

## 2017-06-22 NOTE — ED Triage Notes (Signed)
Patient with ongoing dental work. Presents tonight with ongoing pain in the front on the bottom row.  Patient thinks maybe he has an infection or the nerves are showing. Patient has dentist in place but dental benefits are "used up."

## 2017-06-23 ENCOUNTER — Emergency Department
Admission: EM | Admit: 2017-06-23 | Discharge: 2017-06-23 | Disposition: A | Discharge status: Home / Self Care | Payer: Self-pay | Attending: Emergency Medicine | Admitting: Emergency Medicine

## 2017-06-23 DIAGNOSIS — K029 Dental caries, unspecified: Secondary | ICD-10-CM

## 2017-06-23 MED ORDER — AMOXICILLIN 500 MG PO CAPS
500.0000 mg | ORAL_CAPSULE | Freq: Once | ORAL | Status: AC
  Administered 2017-06-23: 500 mg via ORAL
  Filled 2017-06-23: qty 1

## 2017-06-23 MED ORDER — AMOXICILLIN 875 MG PO TABS: 875.0000 mg | ORAL_TABLET | Freq: Two times a day (BID) | ORAL | 0 refills | Status: DC

## 2017-06-23 MED ORDER — LIDOCAINE VISCOUS 2 % MT SOLN
15.0000 mL | Freq: Once | OROMUCOSAL | Status: AC
  Administered 2017-06-23: 15 mL via OROMUCOSAL
  Filled 2017-06-23: qty 15

## 2017-06-23 MED ORDER — OXYCODONE-ACETAMINOPHEN 5-325 MG PO TABS
1.0000 | ORAL_TABLET | Freq: Once | ORAL | Status: AC
  Administered 2017-06-23: 1 via ORAL
  Filled 2017-06-23: qty 1

## 2017-06-23 MED ORDER — KETOROLAC TROMETHAMINE 10 MG PO TABS: 10.0000 mg | ORAL_TABLET | Freq: Four times a day (QID) | ORAL | 0 refills | Status: DC | PRN

## 2017-06-23 NOTE — ED Provider Notes (Signed)
[facility] Emergency Department Provider Note    First MD Initiated Contact with Patient 06/23/17 [phone]     (approximate)  I have reviewed the triage vital signs and the nursing notes.   HISTORY  Chief Complaint Dental Pain    HPI Jonathon Walters is a 36 y.o. male present with 10 out of 10 right mandible premolar dental pain with onset tonight before arrival to the emergency department. Patient denies any fever no difficulty swallowing. Patient states that he has a dental appointment scheduled for September.   Past medical history None    There are no active problems to display for this patient.  Past surgical history Noncontributory  Prior to Admission medications   Medication Sig Start Date End Date Taking? Authorizing Provider  amoxicillin (AMOXIL) 500 MG tablet Take 1 tablet (500 mg total) by mouth 3 (three) times daily. 02/02/16   Ignacia Bayleyumey, Robert, PA-C  amoxicillin (AMOXIL) 875 MG tablet Take 1 tablet (875 mg total) by mouth 2 (two) times daily. 01/06/16   Cuthriell, Delorise RoyalsJonathan D, PA-C  ibuprofen (ADVIL,MOTRIN) 800 MG tablet Take 1 tablet (800 mg total) by mouth 3 (three) times daily. 08/18/15   Tommi RumpsSummers, Rhonda L, PA-C  magic mouthwash w/lidocaine SOLN Take 5 mLs by mouth 4 (four) times daily. 01/06/16   Cuthriell, Delorise RoyalsJonathan D, PA-C    Allergies Patient has no known allergies.  No family history on file.  Social History Social History  Substance Use Topics  . Smoking status: Current Every Day Smoker    Packs/day: 1.00    Types: Cigarettes  . Smokeless tobacco: Not on file  . Alcohol use Yes    Review of Systems Constitutional: No fever/chills Eyes: No visual changes. ENT: No sore throat.Positive for dental pain Cardiovascular: Denies chest pain. Respiratory: Denies shortness of breath. Gastrointestinal: No abdominal pain.  No nausea, no vomiting.  No diarrhea.  No constipation. Genitourinary: Negative for dysuria. Musculoskeletal:  Negative for neck pain.  Negative for back pain. Integumentary: Negative for rash. Neurological: Negative for headaches, focal weakness or numbness.   ____________________________________________   PHYSICAL EXAM:  VITAL SIGNS: ED Triage Vitals [06/22/17 2153]  Enc Vitals Group     BP (!) 168/87     Pulse Rate 80     Resp 18     Temp 98.3 F (36.8 C)     Temp Source Oral     SpO2 100 %     Weight 97.5 kg (215 lb)     Height 1.778 m (5\' 10" )     Head Circumference      Peak Flow      Pain Score 7     Pain Loc      Pain Edu?      Excl. in GC?     Constitutional: Alert and oriented. Well appearing and in no acute distress. Eyes: Conjunctivae are normal.  Head: Atraumatic. Mouth/Throat: Mucous membranes are moist.Right mandible premolar and canine cavity noted with surrounding gingivitis Neck: No stridor.   Cardiovascular: Normal rate, regular rhythm. Good peripheral circulation. Grossly normal heart sounds. Respiratory: Normal respiratory effort.  No retractions. Lungs CTAB. Gastrointestinal: Soft and nontender. No distention.  Musculoskeletal: No lower extremity tenderness nor edema. No gross deformities of extremities. Neurologic:  Normal speech and language. No gross focal neurologic deficits are appreciated.  Skin:  Skin is warm, dry and intact. No rash noted.     Procedures   ____________________________________________   INITIAL IMPRESSION / ASSESSMENT AND PLAN / ED  COURSE  Pertinent labs & imaging results that were available during my care of the patient were reviewed by me and considered in my medical decision making (see chart for details).  Patient given viscous lidocaine swish and spit, amoxicillin and Percocet emergency department. Patient was prescribed amoxicillin and Toradol for home. Patient referred to [facility] dental [facility].      ____________________________________________  FINAL CLINICAL IMPRESSION(S) / ED DIAGNOSES  Final  diagnoses:  Dental caries     MEDICATIONS GIVEN DURING THIS VISIT:  Medications - No data to display   NEW OUTPATIENT MEDICATIONS STARTED DURING THIS VISIT:  New Prescriptions   No medications on file    Modified Medications   No medications on file    Discontinued Medications   No medications on file     Note:  This document was prepared using Dragon voice recognition software and may include unintentional dictation errors.    Darci Current, MD 06/23/17 0157

## 2017-06-23 NOTE — ED Notes (Signed)
Pt right front lower (approx 26) pain all day today, pt reports hx of gum disease, poor dentition appearing carries, last IBU at approx 1600 using Anbesol and Orgel and Goody's to treat pain, pt reports that cold water helps

## 2017-06-23 NOTE — ED Notes (Signed)
In with Dr Manson PasseyBrown to see patient

## 2017-08-04 ENCOUNTER — Emergency Department
Admission: EM | Admit: 2017-08-04 | Discharge: 2017-08-04 | Disposition: A | Discharge status: Home / Self Care | Payer: Self-pay | Attending: Emergency Medicine | Admitting: Emergency Medicine

## 2017-08-04 DIAGNOSIS — F17210 Nicotine dependence, cigarettes, uncomplicated: Secondary | ICD-10-CM | POA: Insufficient documentation

## 2017-08-04 DIAGNOSIS — K029 Dental caries, unspecified: Secondary | ICD-10-CM | POA: Insufficient documentation

## 2017-08-04 MED ORDER — OXYCODONE-ACETAMINOPHEN 5-325 MG PO TABS
1.0000 | ORAL_TABLET | Freq: Once | ORAL | Status: AC
  Administered 2017-08-04: 1 via ORAL
  Filled 2017-08-04: qty 1

## 2017-08-04 NOTE — ED Notes (Signed)
See triage note  States he developed tooth pain last week  Positive broken and missing teeth noted   Also having some swelling to gumline

## 2017-08-04 NOTE — Discharge Instructions (Signed)
Appointment today with the dentist so that she can determine what needs to be done.

## 2017-08-04 NOTE — ED Provider Notes (Signed)
[facility] Emergency Department Provider Note  ____________________________________________   First MD Initiated Contact with Patient 08/04/17 1039     (approximate)  I have reviewed the triage vital signs and the nursing notes.   HISTORY  Chief Complaint Dental Pain   HPI Jonathon Walters is a 36 y.o. male is here with  complaint of lower dental pain for the last week. Patient was here in the emergency Department and seen on 06/23/17 at which time he was given an antibiotic. Patient states he has an appointment with a dentist in [place] at 2:00 today. He states that the pain is so severe that he cannot stand it. He rates his pain as a 10 over 10.  History reviewed. No pertinent past medical history.  There are no active problems to display for this patient.   History reviewed. No pertinent surgical history.  Prior to Admission medications   Medication Sig Start Date End Date Taking? Authorizing Provider  amoxicillin (AMOXIL) 875 MG tablet Take 1 tablet (875 mg total) by mouth 2 (two) times daily. 06/23/17   Darci Current, MD  ketorolac (TORADOL) 10 MG tablet Take 1 tablet (10 mg total) by mouth every 6 (six) hours as needed. 06/23/17   Darci Current, MD    Allergies Patient has no known allergies.  No family history on file.  Social History Social History  Substance Use Topics  . Smoking status: Current Every Day Smoker    Packs/day: 1.00    Types: Cigarettes  . Smokeless tobacco: Never Used  . Alcohol use Yes    Review of Systems Constitutional: No fever/chills ENT: Positive dental pain. Cardiovascular: Denies chest pain. Respiratory: Denies shortness of breath. Gastrointestinal:  No nausea, no vomiting.   ____________________________________________   PHYSICAL EXAM:  VITAL SIGNS: ED Triage Vitals  Enc Vitals Group     BP 08/04/17 1024 (!) 160/102     Pulse Rate 08/04/17 1024 78     Resp 08/04/17 1024 20     Temp  08/04/17 1024 (!) 97.5 F (36.4 C)     Temp Source 08/04/17 1024 Oral     SpO2 08/04/17 1024 100 %     Weight --      Height --      Head Circumference --      Peak Flow --      Pain Score 08/04/17 0937 10     Pain Loc --      Pain Edu? --      Excl. in GC? --    Constitutional: Alert and oriented. Well appearing and in no acute distress. Eyes: Conjunctivae are normal. PERRL. EOMI. Head: Atraumatic. Mouth/Throat: Mucous membranes are moist.  Oropharynx non-erythematous. Right lower molar and premolar are in poor hygiene and repair. Gums are swollen and tender. No obvious abscess was seen. Neck: No stridor.   Hematological/Lymphatic/Immunilogical: No cervical lymphadenopathy. Cardiovascular: Normal rate, regular rhythm. Grossly normal heart sounds.  Good peripheral circulation. Respiratory: Normal respiratory effort.  No retractions. Lungs CTAB. Musculoskeletal: Patient has normal gait and is pacing the floor in the room. Neurologic:  Normal speech and language. No gross focal neurologic deficits are appreciated.  ___________________________________________   LABS (all labs ordered are listed, but only abnormal results are displayed)  Labs Reviewed - No data to display   PROCEDURES  Procedure(s) performed: None  Procedures  Critical Care performed: No  ____________________________________________   INITIAL IMPRESSION / ASSESSMENT AND PLAN / ED COURSE  Pertinent labs &  imaging results that were available during my care of the patient were reviewed by me and considered in my medical decision making (see chart for details).  Patient was given a prescription for amoxicillin on the 17th. He is encouraged to keep his appointment today with the dentist. He was given Percocet one tablet in the emergency department until he can see his dentist at 2:00.  ___________________________________________   FINAL CLINICAL IMPRESSION(S) / ED DIAGNOSES  Final diagnoses:  Pain due to  dental caries      NEW MEDICATIONS STARTED DURING THIS VISIT:  Discharge Medication List as of 08/04/2017 10:58 AM       Note:  This document was prepared using Dragon voice recognition software and may include unintentional dictation errors.    Tommi Rumps, PA-C 08/04/17 1611    Minna Antis, MD 08/06/17 [phone]

## 2017-08-04 NOTE — ED Triage Notes (Signed)
Pt c/o lower toothache for the past week.Jonathon Walters

## 2017-10-14 ENCOUNTER — Other Ambulatory Visit: Payer: Self-pay

## 2017-10-14 ENCOUNTER — Emergency Department
Admission: EM | Admit: 2017-10-14 | Discharge: 2017-10-14 | Disposition: A | Discharge status: Home / Self Care | Payer: Self-pay | Attending: Emergency Medicine | Admitting: Emergency Medicine

## 2017-10-14 ENCOUNTER — Encounter: Payer: Self-pay | Admitting: Emergency Medicine

## 2017-10-14 DIAGNOSIS — K029 Dental caries, unspecified: Secondary | ICD-10-CM | POA: Insufficient documentation

## 2017-10-14 DIAGNOSIS — K0889 Other specified disorders of teeth and supporting structures: Secondary | ICD-10-CM

## 2017-10-14 DIAGNOSIS — F17210 Nicotine dependence, cigarettes, uncomplicated: Secondary | ICD-10-CM | POA: Insufficient documentation

## 2017-10-14 DIAGNOSIS — K047 Periapical abscess without sinus: Secondary | ICD-10-CM | POA: Insufficient documentation

## 2017-10-14 MED ORDER — IBUPROFEN 800 MG PO TABS: 800.0000 mg | ORAL_TABLET | Freq: Three times a day (TID) | ORAL | 0 refills | Status: DC | PRN

## 2017-10-14 MED ORDER — CHLORHEXIDINE GLUCONATE 0.12 % MT SOLN: 15.0000 mL | Freq: Two times a day (BID) | OROMUCOSAL | 0 refills | Status: DC

## 2017-10-14 MED ORDER — IBUPROFEN 800 MG PO TABS
800.0000 mg | ORAL_TABLET | Freq: Once | ORAL | Status: AC
  Administered 2017-10-14: 800 mg via ORAL
  Filled 2017-10-14: qty 1

## 2017-10-14 MED ORDER — AMOXICILLIN 500 MG PO TABS: 500.0000 mg | ORAL_TABLET | Freq: Three times a day (TID) | ORAL | 0 refills | Status: AC

## 2017-10-14 MED ORDER — LIDOCAINE VISCOUS 2 % MT SOLN
15.0000 mL | Freq: Once | OROMUCOSAL | Status: AC
  Administered 2017-10-14: 15 mL via OROMUCOSAL
  Filled 2017-10-14: qty 15

## 2017-10-14 NOTE — ED Notes (Addendum)
Pt c/o oral swelling and pain to RT side of face, denise any fever. PT has notable swelling noted.

## 2017-10-14 NOTE — ED Triage Notes (Signed)
Pt in via POV with complaints of dental abscess to right upper gum.  Pt reports hx of same, denies any pain at this time, swelling noted to right cheek.  Vitals WDL, NAD noted at this time.

## 2017-10-14 NOTE — ED Provider Notes (Signed)
[facility] Emergency Department Provider Note   ____________________________________________   I have reviewed the triage vital signs and the nursing notes.   HISTORY  Chief Complaint Oral Swelling    HPI Jonathon HowellsWilliam Walters is a 36 y.o. male resents emergency department with right upper dental pain, erythema and swelling that has worsened over the last 2 days.  Patient localizes symptoms along the right upper gumline, oral mucosa and visible swelling along the upper cheek.  Patient reports dental caries and likely broken tooth is likely factor that has calls the progression of the above symptoms.  Patient denies fever, chills, nausea, vomiting or headache over the last 2 days.  She denies any past history of dental infection or abscesses.  Patient is waiting for insurance to come through so that he is able to schedule an appointment for tooth extractions. Patient denies fever, chills, headache, vision changes, chest pain, chest tightness, shortness of breath, abdominal pain, nausea and vomiting.  History reviewed. No pertinent past medical history.  There are no active problems to display for this patient.   History reviewed. No pertinent surgical history.  Prior to Admission medications   Medication Sig Start Date End Date Taking? Authorizing Provider  amoxicillin (AMOXIL) 500 MG tablet Take 1 tablet (500 mg total) 3 (three) times daily for 10 days by mouth. 10/14/17 10/24/17  Tamikka Pilger M, PA-C  chlorhexidine (PERIDEX) 0.12 % solution Use as directed 15 mLs 2 (two) times daily in the mouth or throat. 10/14/17   Jaydn Moscato M, PA-C  ibuprofen (ADVIL,MOTRIN) 800 MG tablet Take 1 tablet (800 mg total) every 8 (eight) hours as needed by mouth. 10/14/17   Kendrea Cerritos M, PA-C  ketorolac (TORADOL) 10 MG tablet Take 1 tablet (10 mg total) by mouth every 6 (six) hours as needed. 06/23/17   Darci CurrentBrown, [place] N, MD    Allergies Patient has no known allergies.  No  family history on file.  Social History Social History   Tobacco Use  . Smoking status: Current Every Day Smoker    Packs/day: 1.00    Types: Cigarettes  . Smokeless tobacco: Never Used  Substance Use Topics  . Alcohol use: Yes  . Drug use: No    Review of Systems Constitutional: Negative for fever/chills Eyes: No visual changes. ENT:  Negative for sore throat and for difficulty swallowing.  Positive for erythema, swelling and pain along the right upper gumline secondary to dental pain. Cardiovascular: Denies chest pain. Respiratory: Denies cough. Denies shortness of breath. Gastrointestinal: No abdominal pain.  No nausea, vomiting, diarrhea. Skin: Negative for rash. Neurological: Negative for headaches. ____________________________________________   PHYSICAL EXAM:  VITAL SIGNS: ED Triage Vitals  Enc Vitals Group     BP 10/14/17 1216 137/84     Pulse Rate 10/14/17 1216 90     Resp 10/14/17 1216 16     Temp 10/14/17 1216 98.3 F (36.8 C)     Temp Source 10/14/17 1216 Oral     SpO2 10/14/17 1216 99 %     Weight --      Height --      Head Circumference --      Peak Flow --      Pain Score 10/14/17 1227 8     Pain Loc --      Pain Edu? --      Excl. in GC? --     Constitutional: Alert and oriented. Well appearing and in no acute distress.  Eyes: Conjunctivae  are normal. PERRL. EOMI  Head: Normocephalic and atraumatic. ENT:           Mouth/Throat: Mucous membranes are moist. Oropharynx erythematous or edematous.  Right upper gumline erythematous, edematous with significant dental caries, broken teeth.  Buccal mucosa edematous with edema without induration.  Upper cheek edematous without induration. Neck:Supple. Cardiovascular: Normal rate, regular rhythm.  Good peripheral circulation. Respiratory: Normal respiratory effort without tachypnea or retractions. Lungs CTAB. Hematological/Lymphatic/Immunological: No cervical lymphadenopathy. Gastrointestinal: Bowel  sounds 4 quadrants. Soft and nontender to palpation.  Neurologic: Normal speech and language.  Skin:  Skin is warm, dry and intact. No rash noted. Psychiatric: Mood and affect are normal. Speech and behavior are normal. Patient exhibits appropriate insight and judgement.  ____________________________________________   LABS (all labs ordered are listed, but only abnormal results are displayed)  Labs Reviewed - No data to display ____________________________________________  EKG None ____________________________________________  RADIOLOGY None ____________________________________________   PROCEDURES  Procedure(s) performed: no    Critical Care performed: no ____________________________________________   INITIAL IMPRESSION / ASSESSMENT AND PLAN / ED COURSE  Pertinent labs & imaging results that were available during my care of the patient were reviewed by me and considered in my medical decision making (see chart for details).  Patient presents to emergency department with right upper gum dental pain with erythema swelling for 2 days. History and physical exam findings are consistent with likely dental infection.  Initial symptom management addressed with ibuprofen and viscous lidocaine given during course of care in the emergency department.  Patient will be prescribed amoxicillin for antibiotic coverage, ibuprofen for pain and inflammation and chlorhexidine mouthwash.  Patient advised to follow up with his dentist as soon as he is able to schedule an appointment.  Also advised him to return to the emergency department if symptoms significantly worsen prior to his scheduled dental appointment. Patient informed of clinical course, understand medical decision-making process, and agree with plan.  ____________________________________________   FINAL CLINICAL IMPRESSION(S) / ED DIAGNOSES  Final diagnoses:  Pain, dental  Dental infection  Dental caries       NEW  MEDICATIONS STARTED DURING THIS VISIT:  This SmartLink is deprecated. Use AVSMEDLIST instead to display the medication list for a patient.   Note:  This document was prepared using Dragon voice recognition software and may include unintentional dictation errors.    Clois ComberLittle, Kelon Easom M, PA-C 10/14/17 1450    Emily FilbertWilliams, Jonathan E, MD 10/14/17 [phone]

## 2017-10-14 NOTE — Discharge Instructions (Signed)
Take medication as prescribed.   Return to emergency department if symptoms worsen and follow-up with PCP as needed.    Schedule appointment with your dentist as soon as you are able to make an appointment.

## 2018-07-23 ENCOUNTER — Encounter: Payer: Self-pay | Admitting: Emergency Medicine

## 2018-07-23 ENCOUNTER — Emergency Department
Admission: EM | Admit: 2018-07-23 | Discharge: 2018-07-23 | Disposition: A | Discharge status: Home / Self Care | Payer: Self-pay | Attending: Emergency Medicine | Admitting: Emergency Medicine

## 2018-07-23 ENCOUNTER — Other Ambulatory Visit: Payer: Self-pay

## 2018-07-23 DIAGNOSIS — F17210 Nicotine dependence, cigarettes, uncomplicated: Secondary | ICD-10-CM | POA: Insufficient documentation

## 2018-07-23 DIAGNOSIS — K047 Periapical abscess without sinus: Secondary | ICD-10-CM

## 2018-07-23 DIAGNOSIS — Z79899 Other long term (current) drug therapy: Secondary | ICD-10-CM | POA: Insufficient documentation

## 2018-07-23 MED ORDER — LIDOCAINE VISCOUS HCL 2 % MT SOLN: 10.0000 mL | OROMUCOSAL | 0 refills | Status: DC | PRN

## 2018-07-23 MED ORDER — AMOXICILLIN 500 MG PO CAPS: 500.0000 mg | ORAL_CAPSULE | Freq: Three times a day (TID) | ORAL | 0 refills | Status: DC

## 2018-07-23 NOTE — ED Notes (Signed)
See triage note   Presents with possible dental abscess   Has pain and swelling to right side of face//mouth

## 2018-07-23 NOTE — ED Provider Notes (Signed)
[facility] Emergency Department Provider Note  ____________________________________________  Time seen: Approximately 2:40 PM  I have reviewed the triage vital signs and the nursing notes.   HISTORY  Chief Complaint Abscess and Dental Pain    HPI Jonathon Walters is a 37 y.o. male that presents emergency department for evaluation of right lower dental pain for 3 days that worsened last night.  He has several broken teeth.  Patient has a Education officer, communitydentist but has not seen him in a while.  No alleviating measures have been attempted.  No fever, chills.  History reviewed. No pertinent past medical history.  There are no active problems to display for this patient.   History reviewed. No pertinent surgical history.  Prior to Admission medications   Medication Sig Start Date End Date Taking? Authorizing Provider  amoxicillin (AMOXIL) 500 MG capsule Take 1 capsule (500 mg total) by mouth 3 (three) times daily. 07/23/18   Enid DerryWagner, Caisen Mangas, PA-C  chlorhexidine (PERIDEX) 0.12 % solution Use as directed 15 mLs 2 (two) times daily in the mouth or throat. 10/14/17   Little, Traci M, PA-C  ibuprofen (ADVIL,MOTRIN) 800 MG tablet Take 1 tablet (800 mg total) every 8 (eight) hours as needed by mouth. 10/14/17   Little, Traci M, PA-C  ketorolac (TORADOL) 10 MG tablet Take 1 tablet (10 mg total) by mouth every 6 (six) hours as needed. 06/23/17   Darci CurrentBrown, [place] N, MD  lidocaine (XYLOCAINE) 2 % solution Use as directed 10 mLs in the mouth or throat as needed for mouth pain. 07/23/18   Enid DerryWagner, Sanjeev Main, PA-C    Allergies Patient has no known allergies.  History reviewed. No pertinent family history.  Social History Social History   Tobacco Use  . Smoking status: Current Every Day Smoker    Packs/day: 1.00    Types: Cigarettes  . Smokeless tobacco: Never Used  Substance Use Topics  . Alcohol use: Not Currently  . Drug use: No     Review of Systems  Constitutional: No  fever/chills Cardiovascular: No chest pain. Respiratory: No SOB. Gastrointestinal: No abdominal pain.  No nausea, no vomiting.  Skin: Negative for rash, abrasions, lacerations, ecchymosis. Neurological: Negative for headache   ____________________________________________   PHYSICAL EXAM:  VITAL SIGNS: ED Triage Vitals  Enc Vitals Group     BP 07/23/18 1401 (!) 144/94     Pulse Rate 07/23/18 1401 88     Resp 07/23/18 1401 14     Temp 07/23/18 1401 98.1 F (36.7 C)     Temp Source 07/23/18 1401 Oral     SpO2 07/23/18 1401 99 %     Weight 07/23/18 1402 220 lb (99.8 kg)     Height 07/23/18 1402 5\' 8"  (1.727 m)     Head Circumference --      Peak Flow --      Pain Score 07/23/18 1401 4     Pain Loc --      Pain Edu? --      Excl. in GC? --      Constitutional: Alert and oriented. Well appearing and in no acute distress. Eyes: Conjunctivae are normal. PERRL. EOMI. Head: Atraumatic. ENT:      Ears:      Nose: No congestion/rhinnorhea.      Mouth/Throat: Mucous membranes are moist.  Poor dentition. Large fracture with surrounding mild cheek swelling and tenderness to palpation of tooth #28. No palpable abscess.  Neck: No stridor.  Cardiovascular: Normal rate, regular rhythm.  Good  peripheral circulation. Respiratory: Normal respiratory effort without tachypnea or retractions. Lungs CTAB. Good air entry to the bases with no decreased or absent breath sounds. Musculoskeletal: Full range of motion to all extremities. No gross deformities appreciated. Neurologic:  Normal speech and language. No gross focal neurologic deficits are appreciated.  Skin:  Skin is warm, dry and intact. No rash noted. Psychiatric: Mood and affect are normal. Speech and behavior are normal. Patient exhibits appropriate insight and judgement.   ____________________________________________   LABS (all labs ordered are listed, but only abnormal results are displayed)  Labs Reviewed - No data to  display ____________________________________________  EKG   ____________________________________________  RADIOLOGY   No results found.  ____________________________________________    PROCEDURES  Procedure(s) performed:    Procedures    Medications - No data to display   ____________________________________________   INITIAL IMPRESSION / ASSESSMENT AND PLAN / ED COURSE  Pertinent labs & imaging results that were available during my care of the patient were reviewed by me and considered in my medical decision making (see chart for details).  Review of the [place] CSRS was performed in accordance of the [facility] prior to dispensing any controlled drugs.    Patient's diagnosis is consistent with dental abscess. Patient will be discharged home with prescriptions for amoxicillin. Patient is to follow up with dentist as directed. Patient is given ED precautions to return to the ED for any worsening or new symptoms.     ____________________________________________  FINAL CLINICAL IMPRESSION(S) / ED DIAGNOSES  Final diagnoses:  Dental abscess      NEW MEDICATIONS STARTED DURING THIS VISIT:  ED Discharge Orders         Ordered    amoxicillin (AMOXIL) 500 MG capsule  3 times daily,   Status:  Discontinued     07/23/18 1444    lidocaine (XYLOCAINE) 2 % solution  As needed,   Status:  Discontinued     07/23/18 1444    amoxicillin (AMOXIL) 500 MG capsule  3 times daily     07/23/18 1455    lidocaine (XYLOCAINE) 2 % solution  As needed     07/23/18 1456              This chart was dictated using voice recognition software/Dragon. Despite best efforts to proofread, errors can occur which can change the meaning. Any change was purely unintentional.    [place] DerryWagner, Anelly Samarin, PA-C 07/23/18 1528    Emily FilbertWilliams, Jonathan E, MD 07/23/18 1550

## 2018-07-23 NOTE — Discharge Instructions (Signed)
OPTIONS FOR DENTAL FOLLOW UP CARE ° °[place] Department of Health and Human Services - Local Safety Net Dental Clinics °http://www.ncdhhs.gov/dph/oralhealth/services/safetynetclinics.htm °  °[facility] (336-562-3123) ° °[facility] [place] (919-933-9087) ° °[facility] ([phone] ext [phone]) ° °[facility] ([phone]) ° °[facility] ([phone]) °This clinic caters to the indigent population and is on a lottery system. °Location: °[facility], [address], [place] °Clinic Hours: °Wednesdays from 6pm - 9pm, patients seen by a lottery system. °For dates, call or go to [email or web address].[facility].edu/[facility]/patients/Dental-[facility] °Services: °Cleanings, fillings and simple extractions. °Payment Options: °DENTAL WORK IS FREE OF CHARGE. Bring proof of income or support. °Best way to get seen: °Arrive at 5:15 pm - this is a lottery, NOT first come/first serve, so arriving earlier will not increase your chances of being seen. °  °  °[facility] °[phone] °Select option 1 for emergencies °  °Location: °[facility], [address], [place] °Clinic Hours: °No walk-ins accepted - call the day before to schedule an appointment. °Check in times are 9:30 am and 1:30 pm. °Services: °Simple extractions, temporary fillings, pulpectomy/pulp debridement, uncomplicated abscess drainage. °Payment Options: °PAYMENT IS DUE AT THE TIME OF SERVICE.  Fee is usually $100-200, additional surgical procedures (e.g. abscess drainage) may be extra. °Cash, checks, Visa/MasterCard accepted.  Can file Medicaid if patient is covered for dental - patient should call case worker to check. °No discount for [facility] patients. °Best way to get seen: °MUST call the day before and get onto the schedule. Can usually be seen the next 1-2 days. No walk-ins accepted. °  °  °[facility] °[phone] °   °Location: °[facility], [address], [place] °Clinic Hours: °M, W, Th, F 8am or 1:30pm, Tues 9a or 1:30 - first come/first served. °Services: °Simple extractions, temporary fillings, uncomplicated abscess drainage.  You do not need to be an [place] resident. °Payment Options: °PAYMENT IS DUE AT THE TIME OF SERVICE. °Dental insurance, otherwise sliding scale - bring proof of income or support. °Depending on income and treatment needed, cost is usually $50-200. °Best way to get seen: °Arrive early as it is first come/first served. °  °  °[facility] °[phone] °  °Location: °[address] °Clinic Hours: °Mon-Thu 8a-5p °Services: °Most basic dental services including extractions and fillings. °Payment Options: °PAYMENT IS DUE AT THE TIME OF SERVICE. °Sliding scale, up to 50% off - bring proof if income or support. °Medicaid with dental option accepted. °Best way to get seen: °Call to schedule an appointment, can usually be seen within 2 weeks OR they will try to see walk-ins - show up at 8a or 2p (you may have to wait). °  °  °[facility] °919-245-2435 °[place] RESIDENTS ONLY °  °Location: °[facility], [address], [place], [place] [address] °Clinic Hours: By appointment only. °Monday - Thursday 8am-5pm, Friday 8am-12pm °Services: Cleanings, fillings, extractions. °Payment Options: °PAYMENT IS DUE AT THE TIME OF SERVICE. °Cash, Visa or MasterCard. Sliding scale - $30 minimum per service. °Best way to get seen: °Come in to office, complete packet and make an appointment - need proof of income °or support monies for each household member and proof of [place] residence. °Usually takes about a month to get in. °  °  °[facility] °[phone] °  °Location: °[address]   [place] °Clinic Hours: Walk-in Urgent Care Dental Services are offered Monday-Friday  mornings only. °The numbers of emergencies accepted daily is limited to the number of °providers available. °Maximum 15 - Mondays, Wednesdays & Thursdays °Maximum 10 - Tuesdays & Fridays °Services: °You do not need to be a [place] resident to be seen for a dental emergency. °Emergencies are defined as pain, swelling, abnormal bleeding, or dental trauma. Walkins will receive x-rays if needed. °NOTE: Dental cleaning is not an emergency. °Payment Options: °PAYMENT IS DUE AT THE TIME OF SERVICE. °Minimum co-pay is $40.00 for uninsured patients. °Minimum co-pay is $3.00 for Medicaid with dental coverage. °Dental Insurance is accepted and must be presented at time of visit. °Medicare does not cover dental. °Forms of payment: Cash, credit card, checks. °Best way to get seen: °If not previously registered with the clinic, walk-in dental registration begins at 7:15 am and is on a first come/first serve basis. °If previously registered with the clinic, call to make an appointment. °  °  °[facility] °[phone] °[place] RESIDENTS ONLY °  °Location: °[address], [place], [place] °Clinic Hours: °Mon-Thu 10a-2p °Services: Extractions only! °Payment Options: °FREE (donations accepted) - bring proof of income or support °Best way to get seen: °Call and schedule an appointment OR come at 8am on the 1st Monday of every month (except for holidays) when it is first come/first served. °  °  °[facility] °[phone] °  °Location: °[address], [place] °Clinic Hours: °Friday mornings °Services, Payment Options, Best way to get seen: °Call for info °

## 2018-07-23 NOTE — ED Triage Notes (Signed)
Pt to ED from home c/o right lower mouth pain since Monday, worse today.  Some swelling noted.  Maintaining secretions, chest rise even and unlabored.

## 2019-01-14 ENCOUNTER — Other Ambulatory Visit: Payer: Self-pay

## 2019-01-14 ENCOUNTER — Emergency Department
Admission: EM | Admit: 2019-01-14 | Discharge: 2019-01-14 | Disposition: A | Discharge status: Home / Self Care | Payer: BLUE CROSS/BLUE SHIELD | Attending: Emergency Medicine | Admitting: Emergency Medicine

## 2019-01-14 DIAGNOSIS — W208XXA Other cause of strike by thrown, projected or falling object, initial encounter: Secondary | ICD-10-CM | POA: Diagnosis not present

## 2019-01-14 DIAGNOSIS — Y929 Unspecified place or not applicable: Secondary | ICD-10-CM | POA: Diagnosis not present

## 2019-01-14 DIAGNOSIS — J00 Acute nasopharyngitis [common cold]: Secondary | ICD-10-CM

## 2019-01-14 DIAGNOSIS — Y939 Activity, unspecified: Secondary | ICD-10-CM | POA: Diagnosis not present

## 2019-01-14 DIAGNOSIS — Y999 Unspecified external cause status: Secondary | ICD-10-CM | POA: Insufficient documentation

## 2019-01-14 DIAGNOSIS — F17210 Nicotine dependence, cigarettes, uncomplicated: Secondary | ICD-10-CM | POA: Insufficient documentation

## 2019-01-14 DIAGNOSIS — S0502XA Injury of conjunctiva and corneal abrasion without foreign body, left eye, initial encounter: Secondary | ICD-10-CM

## 2019-01-14 DIAGNOSIS — R0989 Other specified symptoms and signs involving the circulatory and respiratory systems: Secondary | ICD-10-CM | POA: Diagnosis present

## 2019-01-14 MED ORDER — FLUTICASONE PROPIONATE 50 MCG/ACT NA SUSP: 2.0000 | Freq: Every day | NASAL | 0 refills | Status: DC

## 2019-01-14 MED ORDER — FLUORESCEIN SODIUM 1 MG OP STRP
1.0000 | ORAL_STRIP | Freq: Once | OPHTHALMIC | Status: AC
  Administered 2019-01-14: 1 via OPHTHALMIC
  Filled 2019-01-14: qty 1

## 2019-01-14 MED ORDER — BENZONATATE 100 MG PO CAPS: ORAL_CAPSULE | ORAL | 0 refills | Status: DC

## 2019-01-14 MED ORDER — TETRACAINE HCL 0.5 % OP SOLN
2.0000 [drp] | Freq: Once | OPHTHALMIC | Status: AC
  Administered 2019-01-14: 2 [drp] via OPHTHALMIC
  Filled 2019-01-14: qty 4

## 2019-01-14 MED ORDER — SULFACETAMIDE SODIUM 10 % OP SOLN: 2.0000 [drp] | Freq: Four times a day (QID) | OPHTHALMIC | 0 refills | Status: AC

## 2019-01-14 NOTE — ED Notes (Signed)
flu swab was sent to lab.

## 2019-01-14 NOTE — Discharge Instructions (Signed)
Your symptoms are consistent with a rhinitis and post-nasal drip. Take the prescription meds for cough as directed. Take OTC Benadryl or Zyrtec for runny nose symptoms. Take OTC Delsym for additional cough relief. Use the eye drops as directed.

## 2019-01-14 NOTE — ED Triage Notes (Signed)
Pt comes via POV from home with c/o sore throat and runny nose.  Pt denies any fevers.

## 2019-01-16 NOTE — ED Provider Notes (Signed)
[facility] Emergency Department Provider Note ____________________________________________  Time seen: 2010  I have reviewed the triage vital signs and the nursing notes.  HISTORY  Chief Complaint  FLU SYMPTOMS  HPI Jonathon Walters is a 38 y.o. male presents to the ED accompanied by his family, for concern over flulike symptoms.  Patient describes onset today of scratchy throat and runny nose.  He also noted some sinus congestion and postnasal drainage.  He denies any fevers, chills, sweats, nausea, vomiting, or dizziness.  Primary complaint for his daughter is a sore throat at this time.  1 of his daughters has no symptom complaints at this time.  Patient also has a secondary concern for some left eye irritation and foreign body sensation.  He admits to a small tree branch hitting him in the eye 2 days prior.  He has had excessive tearing to the eye and some intermittent blurry vision.  Patient did not receive the seasonal flu vaccine.  History reviewed. No pertinent past medical history.  There are no active problems to display for this patient.  History reviewed. No pertinent surgical history.  Prior to Admission medications   Medication Sig Start Date End Date Taking? Authorizing Provider  benzonatate (TESSALON PERLES) 100 MG capsule Take 1-2 tabs TID prn cough 01/14/19   Tanyia Grabbe, Charlesetta Ivory, PA-C  fluticasone (FLONASE) 50 MCG/ACT nasal spray Place 2 sprays into both nostrils daily. 01/14/19   Clarie Camey, Charlesetta Ivory, PA-C  sulfacetamide (BLEPH-10) 10 % ophthalmic solution Place 2 drops into the left eye 4 (four) times daily for 7 days. 01/14/19 01/21/19  Blaike Newburn, Charlesetta Ivory, PA-C   Allergies Patient has no known allergies.  No family history on file.  Social History Social History   Tobacco Use  . Smoking status: Current Every Day Smoker    Packs/day: 1.00    Types: Cigarettes  . Smokeless tobacco: Never Used  Substance Use Topics  . Alcohol use:  Not Currently  . Drug use: No    Review of Systems  Constitutional: Negative for fever. Eyes: Positive for visual changes and excessive tearing.. ENT: Positive for sore throat.  Reports runny nose and sinus drainage. Cardiovascular: Negative for chest pain. Respiratory: Negative for shortness of breath. Gastrointestinal: Negative for abdominal pain, vomiting and diarrhea. Musculoskeletal: Negative for back pain. Skin: Negative for rash. Neurological: Negative for headaches, focal weakness or numbness. ____________________________________________  PHYSICAL EXAM:  VITAL SIGNS: ED Triage Vitals  Enc Vitals Group     BP 01/14/19 2000 (!) 152/93     Pulse Rate 01/14/19 2000 86     Resp 01/14/19 2000 18     Temp 01/14/19 2000 98.4 F (36.9 C)     Temp Source 01/14/19 2000 Oral     SpO2 01/14/19 2000 100 %     Weight 01/14/19 1958 220 lb (99.8 kg)     Height 01/14/19 1958 5\' 9"  (1.753 m)     Head Circumference --      Peak Flow --      Pain Score 01/14/19 2051 0     Pain Loc --      Pain Edu? --      Excl. in [place]? --     Constitutional: Alert and oriented. Well appearing and in no distress. Head: Normocephalic and atraumatic. Eyes: Conjunctivae are normal. PERRL. Normal extraocular movements.  No gross foreign body is appreciated on the left eye.  Excessive tearing is noted.  No fluorescein dye uptake on exam.  Ears: Canals clear. TMs intact bilaterally. Nose: No congestion/epistaxis.  Nasal turbinates are pink, moist, edematous and copious amounts of clear mucus is noted. Mouth/Throat: Mucous membranes are moist.  Uvula is midline and tonsils are flat.  No oropharyngeal lesions appreciated. Neck: Supple. No thyromegaly. Hematological/Lymphatic/Immunological: No cervical lymphadenopathy. Cardiovascular: Normal rate, regular rhythm. Normal distal pulses. Respiratory: Normal respiratory effort. No wheezes/rales/rhonchi. Gastrointestinal: Soft and nontender. No  distention. ____________________________________________  PROCEDURES  Procedures Tetracaine 2 drops OS ____________________________________________  INITIAL IMPRESSION / ASSESSMENT AND PLAN / ED COURSE  Patient with ED evaluation of scratchy throat and runny nose.  He also has some left eye irritation with concern for corneal abrasion.  Patient clinical exam is benign and reassuring at this time.  No fevers, cough or congestion at this time.  Patient's symptoms are consistent with an acute rhinitis.  Left eye reveals no obvious corneal abrasion but the patient be treated with an empiric antibiotic for possible resolving corneal abrasion.  Patient will follow with primary provider or return to the ED as needed.  He may take over-the-counter medications for additional symptom relief. ____________________________________________  FINAL CLINICAL IMPRESSION(S) / ED DIAGNOSES  Final diagnoses:  Acute rhinitis  Abrasion of left cornea, initial encounter      Lissa Hoard, PA-C 01/16/19 1023    Rockne Menghini, MD 01/19/19 1249

## 2019-11-14 ENCOUNTER — Encounter: Payer: Self-pay | Admitting: Emergency Medicine

## 2019-11-14 ENCOUNTER — Other Ambulatory Visit: Payer: Self-pay

## 2019-11-14 ENCOUNTER — Emergency Department
Admission: EM | Admit: 2019-11-14 | Discharge: 2019-11-14 | Disposition: A | Discharge status: Home / Self Care | Payer: BC Managed Care – PPO | Attending: Emergency Medicine | Admitting: Emergency Medicine

## 2019-11-14 DIAGNOSIS — K0889 Other specified disorders of teeth and supporting structures: Secondary | ICD-10-CM | POA: Diagnosis present

## 2019-11-14 DIAGNOSIS — K029 Dental caries, unspecified: Secondary | ICD-10-CM | POA: Diagnosis not present

## 2019-11-14 DIAGNOSIS — F17210 Nicotine dependence, cigarettes, uncomplicated: Secondary | ICD-10-CM | POA: Insufficient documentation

## 2019-11-14 MED ORDER — AMOXICILLIN 500 MG PO CAPS: 500.0000 mg | ORAL_CAPSULE | Freq: Three times a day (TID) | ORAL | 0 refills | Status: DC

## 2019-11-14 NOTE — ED Notes (Signed)
Pt states dental pain. States he wanted to wait till tomorrow but the pain is unbearable and he couldn't take it anymore.

## 2019-11-14 NOTE — ED Provider Notes (Signed)
[facility] Emergency Department Provider Note  ____________________________________________   First MD Initiated Contact with Patient 11/14/19 1406     (approximate)  I have reviewed the triage vital signs and the nursing notes.   HISTORY  Chief Complaint Dental Pain    HPI Jonathon Walters is a 38 y.o. male presents emergency department complaining of right-sided dental pain.  States he has had some swelling.  Took some ibuprofen around 12 which is now helping the pain.  States his regular dentist is on [address].  Cannot remember her name.  He is going to call follow-up this week.  No chest pain or shortness of breath.  No fever or chills.    History reviewed. No pertinent past medical history.  There are no active problems to display for this patient.   History reviewed. No pertinent surgical history.  Prior to Admission medications   Medication Sig Start Date End Date Taking? Authorizing Provider  amoxicillin (AMOXIL) 500 MG capsule Take 1 capsule (500 mg total) by mouth 3 (three) times daily. 11/14/19   Fisher, Roselyn Bering, PA-C  fluticasone (FLONASE) 50 MCG/ACT nasal spray Place 2 sprays into both nostrils daily. 01/14/19 11/14/19  Menshew, Charlesetta Ivory, PA-C    Allergies Patient has no known allergies.  No family history on file.  Social History Social History   Tobacco Use  . Smoking status: Current Every Day Smoker    Packs/day: 1.00    Types: Cigarettes  . Smokeless tobacco: Never Used  Substance Use Topics  . Alcohol use: Not Currently  . Drug use: No    Review of Systems  Constitutional: No fever/chills Eyes: No visual changes. ENT: No sore throat.  Positive for dental pain Respiratory: Denies cough Genitourinary: Negative for dysuria. Musculoskeletal: Negative for back pain. Skin: Negative for rash.    ____________________________________________   PHYSICAL EXAM:  VITAL SIGNS: ED Triage Vitals  Enc Vitals  Group     BP 11/14/19 1303 (!) 151/98     Pulse Rate 11/14/19 1303 95     Resp 11/14/19 1303 16     Temp 11/14/19 1303 98.4 F (36.9 C)     Temp Source 11/14/19 1303 Oral     SpO2 11/14/19 1303 98 %     Weight --      Height --      Head Circumference --      Peak Flow --      Pain Score 11/14/19 1258 0     Pain Loc --      Pain Edu? --      Excl. in [place]? --     Constitutional: Alert and oriented. Well appearing and in no acute distress. Eyes: Conjunctivae are normal.  Head: Atraumatic.  Mild swelling noted at the right lower jaw Nose: No congestion/rhinnorhea. Mouth/Throat: Mucous membranes are moist.  Widespread poor dentition, some redness noted at the right lower gumline around the tooth that is broken off at the gumline. Neck:  supple no lymphadenopathy noted Cardiovascular: Normal rate, regular rhythm. Heart sounds are normal Respiratory: Normal respiratory effort.  No retractions, lungs c t a  GU: deferred Musculoskeletal: FROM all extremities, warm and well perfused Neurologic:  Normal speech and language.  Skin:  Skin is warm, dry and intact. No rash noted. Psychiatric: Mood and affect are normal. Speech and behavior are normal.  ____________________________________________   LABS (all labs ordered are listed, but only abnormal results are displayed)  Labs Reviewed - No data  to display ____________________________________________   ____________________________________________  RADIOLOGY    ____________________________________________   PROCEDURES  Procedure(s) performed: No  Procedures    ____________________________________________   INITIAL IMPRESSION / ASSESSMENT AND PLAN / ED COURSE  Pertinent labs & imaging results that were available during my care of the patient were reviewed by me and considered in my medical decision making (see chart for details).   Patient is 38 year old male presents emergency department for dental pain.  Physical  exam shows poor dentition and questionable dental abscess  Explained findings to the patient.  Started on amoxicillin.  Is to continue take ibuprofen.  Return if worsening.  See his regular doctor.  If he is unable to get appointments regular dentist he could go to [facility]ters.  He was discharged stable condition    Jonathon Walters was evaluated in Emergency Department on 11/14/2019 for the symptoms described in the history of present illness. He was evaluated in the context of the global COVID-19 pandemic, which necessitated consideration that the patient might be at risk for infection with the SARS-CoV-2 virus that causes COVID-19. Institutional protocols and algorithms that pertain to the evaluation of patients at risk for COVID-19 are in a state of rapid change based on information released by regulatory bodies including the CDC and federal and state organizations. These policies and algorithms were followed during the patient's care in the ED.   As part of my medical decision making, I reviewed the following data within the [place] notes reviewed and incorporated, Old chart reviewed, Notes from prior ED visits and [place] Controlled Substance Database  ____________________________________________   FINAL CLINICAL IMPRESSION(S) / ED DIAGNOSES  Final diagnoses:  Pain due to dental caries      NEW MEDICATIONS STARTED DURING THIS VISIT:  New Prescriptions   AMOXICILLIN (AMOXIL) 500 MG CAPSULE    Take 1 capsule (500 mg total) by mouth 3 (three) times daily.     Note:  This document was prepared using Dragon voice recognition software and may include unintentional dictation errors.    Versie Starks, PA-C 11/14/19 1428    Blake Divine, MD 11/14/19 [id number]

## 2019-11-14 NOTE — ED Notes (Signed)
Pt verbalized understanding of discharge instructions. NAD at this time. 

## 2019-11-14 NOTE — Discharge Instructions (Addendum)
Follow-up with your regular dentist.  If not you follow-up [facility] dental clinic that works on a sliding scale.  Take your antibiotic as prescribed.

## 2019-11-14 NOTE — ED Triage Notes (Signed)
Pt to ED via POV c/o dental pain on the right lower side. Pt is in NAD.

## 2019-12-07 ENCOUNTER — Other Ambulatory Visit: Payer: Self-pay

## 2019-12-07 ENCOUNTER — Emergency Department
Admission: EM | Admit: 2019-12-07 | Discharge: 2019-12-07 | Disposition: A | Discharge status: Home / Self Care | Payer: BC Managed Care – PPO | Attending: Emergency Medicine | Admitting: Emergency Medicine

## 2019-12-07 DIAGNOSIS — K0889 Other specified disorders of teeth and supporting structures: Secondary | ICD-10-CM | POA: Insufficient documentation

## 2019-12-07 DIAGNOSIS — F17210 Nicotine dependence, cigarettes, uncomplicated: Secondary | ICD-10-CM | POA: Diagnosis not present

## 2019-12-07 MED ORDER — NAPROXEN 500 MG PO TABS
500.0000 mg | ORAL_TABLET | Freq: Once | ORAL | Status: AC
  Administered 2019-12-07: 22:00:00 500 mg via ORAL
  Filled 2019-12-07: qty 1

## 2019-12-07 MED ORDER — AMOXICILLIN 500 MG PO CAPS: 500.0000 mg | ORAL_CAPSULE | Freq: Three times a day (TID) | ORAL | 0 refills | Status: DC

## 2019-12-07 MED ORDER — NAPROXEN 500 MG PO TABS: 500.0000 mg | ORAL_TABLET | Freq: Two times a day (BID) | ORAL | 0 refills | Status: DC

## 2019-12-07 MED ORDER — AMOXICILLIN 500 MG PO CAPS
500.0000 mg | ORAL_CAPSULE | Freq: Once | ORAL | Status: AC
  Administered 2019-12-07: 22:00:00 500 mg via ORAL
  Filled 2019-12-07: qty 1

## 2019-12-07 NOTE — ED Provider Notes (Signed)
[facility] Emergency Department Provider Note ____________________________________________  Time seen: Approximately 10:06 PM  I have reviewed the triage vital signs and the nursing notes.   HISTORY  Chief Complaint Dental Pain   HPI Jonathon Walters is a 38 y.o. male who presents to the emergency department for treatment and evaluation of dental pain that started 2 days ago. Some relief with ibuprofen and Goody powder last night but today pain has gotten worse. He has an appointment with his dentist on Jan.4. He was here earlier in December for dental pain but couldn't get in until January. Different area of dental pain today.   History reviewed. No pertinent past medical history.  There are no problems to display for this patient.   History reviewed. No pertinent surgical history.  Prior to Admission medications   Medication Sig Start Date End Date Taking? Authorizing Provider  amoxicillin (AMOXIL) 500 MG capsule Take 1 capsule (500 mg total) by mouth 3 (three) times daily. 12/07/19   Gabrielle Mester, Johnette Abraham B, FNP  naproxen (NAPROSYN) 500 MG tablet Take 1 tablet (500 mg total) by mouth 2 (two) times daily with a meal. 12/07/19   Chanah Tidmore B, FNP  fluticasone (FLONASE) 50 MCG/ACT nasal spray Place 2 sprays into both nostrils daily. 01/14/19 11/14/19  Menshew, Dannielle Karvonen, PA-C    Allergies Patient has no known allergies.  No family history on file.  Social History Social History   Tobacco Use  . Smoking status: Current Every Day Smoker    Packs/day: 1.00    Types: Cigarettes  . Smokeless tobacco: Never Used  Substance Use Topics  . Alcohol use: Yes    Comment: on occassion  . Drug use: No    Review of Systems Constitutional: Negative for fever or recent illness. ENT: Positive for dental pain. Musculoskeletal: Negative for trismus of the jaw.  Skin: Negative for wound or lesion. ____________________________________________   PHYSICAL  EXAM:  VITAL SIGNS: ED Triage Vitals [12/07/19 1928]  Enc Vitals Group     BP (!) 143/92     Pulse Rate 75     Resp 18     Temp 98 F (36.7 C)     Temp Source Oral     SpO2 100 %     Weight 225 lb (102.1 kg)     Height 5\' 10"  (1.778 m)     Head Circumference      Peak Flow      Pain Score 10     Pain Loc      Pain Edu?      Excl. in [place]?     Constitutional: Alert and oriented. Well appearing and in no acute distress. Eyes: Conjunctiva are clear without discharge or drainage. Mouth/Throat: widespread dental decay. Sublingual surface is soft. Periodontal Exam    Hematological/Lymphatic/Immunilogical: No palpable anterior cervical adenopathy. Respiratory: Respirations even and unlabored. Musculoskeletal: Full ROM of the jaw. Neurologic: Awake, alert, oriented.  Skin:  No facial swelling associated with area of dental pain. Psychiatric: Affect and behavior intact.  ____________________________________________   LABS (all labs ordered are listed, but only abnormal results are displayed)  Labs Reviewed - No data to display ____________________________________________   RADIOLOGY  Not indicated. ____________________________________________   PROCEDURES  Procedure(s) performed:   Procedures  Critical Care performed: No ____________________________________________   INITIAL IMPRESSION / ASSESSMENT AND PLAN / ED COURSE  Jonathon Walters is a 38 y.o. male presents to the emergency department for treatment of dental pain. See HPI for  further details. He will be prescribed amoxicillin and naprosyn. First doses given here tonight. He is to keep his scheduled dentist appointment. For symptoms that change or worsen, he is to return to the ER if unable to see the dentist.  Pertinent labs & imaging results that were available during my care of the patient were reviewed by me and considered in my medical decision making (see chart for  details).  ____________________________________________   FINAL CLINICAL IMPRESSION(S) / ED DIAGNOSES  Final diagnoses:  Pain, dental    New Prescriptions   NAPROXEN (NAPROSYN) 500 MG TABLET    Take 1 tablet (500 mg total) by mouth 2 (two) times daily with a meal.    If controlled substance prescribed during this visit, 12 month history viewed on the [facility] prior to issuing an initial prescription for Schedule II or III opiod.  Note:  This document was prepared using Dragon voice recognition software and may include unintentional dictation errors.   Chinita Pester, FNP 12/07/19 2212    Minna Antis, MD 12/07/19 2233

## 2019-12-07 NOTE — ED Notes (Signed)
Patient refused discharge vitals. NAD, walking around room

## 2019-12-07 NOTE — ED Triage Notes (Signed)
Pt to the upper right back of the mouth r/t a cavity.

## 2022-09-17 ENCOUNTER — Emergency Department
Admission: EM | Admit: 2022-09-17 | Discharge: 2022-09-17 | Disposition: A | Discharge status: Home / Self Care | Payer: Managed Care, Other (non HMO) | Attending: Emergency Medicine | Admitting: Emergency Medicine

## 2022-09-17 ENCOUNTER — Encounter: Payer: Self-pay | Admitting: Medical Oncology

## 2022-09-17 ENCOUNTER — Other Ambulatory Visit: Payer: Self-pay

## 2022-09-17 DIAGNOSIS — U071 COVID-19: Secondary | ICD-10-CM | POA: Insufficient documentation

## 2022-09-17 DIAGNOSIS — R030 Elevated blood-pressure reading, without diagnosis of hypertension: Secondary | ICD-10-CM | POA: Insufficient documentation

## 2022-09-17 DIAGNOSIS — F17210 Nicotine dependence, cigarettes, uncomplicated: Secondary | ICD-10-CM | POA: Diagnosis not present

## 2022-09-17 DIAGNOSIS — J029 Acute pharyngitis, unspecified: Secondary | ICD-10-CM | POA: Diagnosis present

## 2022-09-17 LAB — RESP PANEL BY RT-PCR (FLU A&B, COVID) ARPGX2
Influenza A by PCR: NEGATIVE
Influenza B by PCR: NEGATIVE
SARS Coronavirus 2 by RT PCR: POSITIVE — AB

## 2022-09-17 LAB — GROUP A STREP BY PCR: Group A Strep by PCR: NOT DETECTED

## 2022-09-17 NOTE — ED Triage Notes (Signed)
Pt reports sore throat, fever and covid sx's that began Friday. Daughter recently tested pos for covid.

## 2022-09-17 NOTE — ED Provider Notes (Signed)
[facility] Provider Note    Event Date/Time   First MD Initiated Contact with Patient 09/17/22 [phone]     (approximate)   History   Sore Throat and Fever   HPI  Jonathon Walters is a 41 y.o. male Jonathon Walters to the ED with complaint of sore throat, fever and COVID symptoms that began on 09/13/2022.  Patient states that his daughter recently tested positive for COVID.  Patient did not get the COVID-vaccine.  She does pack-a-day smoker.     Physical Exam   Triage Vital Signs: ED Triage Vitals  Enc Vitals Group     BP 09/17/22 0744 (!) 152/106     Pulse Rate 09/17/22 0744 92     Resp 09/17/22 0744 18     Temp 09/17/22 0744 98.9 F (37.2 C)     Temp Source 09/17/22 0744 Oral     SpO2 09/17/22 0744 97 %     Weight 09/17/22 0740 250 lb (113.4 kg)     Height 09/17/22 0740 5\' 10"  (1.778 m)     Head Circumference --      Peak Flow --      Pain Score 09/17/22 0740 0     Pain Loc --      Pain Edu? --      Excl. in [place]? --     Most recent vital signs: Vitals:   09/17/22 0744  BP: (!) 152/106  Pulse: 92  Resp: 18  Temp: 98.9 F (37.2 C)  SpO2: 97%     General: Awake, no distress.  CV:  Good peripheral perfusion.  Heart regular rate and rhythm. Resp:  Normal effort.  Lungs are clear bilaterally. Abd:  No distention.  Other:     ED Results / Procedures / Treatments   Labs (all labs ordered are listed, but only abnormal results are displayed) Labs Reviewed  RESP PANEL BY RT-PCR (FLU A&B, COVID) ARPGX2 - Abnormal; Notable for the following components:      Result Value   SARS Coronavirus 2 by RT PCR POSITIVE (*)    All other components within normal limits  GROUP A STREP BY PCR       PROCEDURES:  Critical Care performed:   Procedures   MEDICATIONS ORDERED IN ED: Medications - No data to display   IMPRESSION / MDM / [place] / ED COURSE  I reviewed the triage vital signs and the nursing notes.   Differential diagnosis  includes, but is not limited to, strep pharyngitis, influenza, COVID, upper respiratory infection.  41 year old male presents to the ED with complaint of sore throat fever and COVID symptoms.  He had close exposure to COVID and is aware that he tested positive today.  Strep pharyngitis and influenza were negative.  Patient did not get the COVID-vaccine and is aware that he needs to quarantine for full 10 days after his initial symptoms.  Increase fluids to stay hydrated, Tylenol or ibuprofen as needed and over-the-counter medication for cough..  Patient's blood pressure was elevated and should have it rechecked in 1 to 2 weeks.  A work note was written for him to remain out of work until 1016/23.      Patient's presentation is most consistent with acute complicated illness / injury requiring diagnostic workup.  FINAL CLINICAL IMPRESSION(S) / ED DIAGNOSES   Final diagnoses:  COVID  Elevated blood pressure reading     Rx / DC Orders   ED Discharge Orders  None        Note:  This document was prepared using Dragon voice recognition software and may include unintentional dictation errors.   Johnn Hai, PA-C 09/17/22 [id number]    Lucillie Garfinkel, MD 09/17/22 2045

## 2022-09-17 NOTE — Discharge Instructions (Addendum)
Take Tylenol or ibuprofen as needed for fever and sore throat pain. Increase fluids to stay hydrated.  You may take over-the-counter cough medication as needed. Your blood pressure was elevated at 152/106 which should be rechecked by your primary care provider or urgent care in the next 1 to 2 weeks.

## 2023-02-02 ENCOUNTER — Emergency Department
Admission: EM | Admit: 2023-02-02 | Discharge: 2023-02-03 | Disposition: A | Discharge status: Home / Self Care | Payer: Managed Care, Other (non HMO) | Attending: Emergency Medicine | Admitting: Emergency Medicine

## 2023-02-02 ENCOUNTER — Other Ambulatory Visit: Payer: Self-pay

## 2023-02-02 ENCOUNTER — Emergency Department: Payer: Managed Care, Other (non HMO)

## 2023-02-02 DIAGNOSIS — K529 Noninfective gastroenteritis and colitis, unspecified: Secondary | ICD-10-CM | POA: Diagnosis not present

## 2023-02-02 DIAGNOSIS — R1030 Lower abdominal pain, unspecified: Secondary | ICD-10-CM | POA: Diagnosis present

## 2023-02-02 DIAGNOSIS — F17200 Nicotine dependence, unspecified, uncomplicated: Secondary | ICD-10-CM | POA: Diagnosis not present

## 2023-02-02 LAB — URINALYSIS, ROUTINE W REFLEX MICROSCOPIC
Bilirubin Urine: NEGATIVE
Glucose, UA: NEGATIVE mg/dL
Hgb urine dipstick: NEGATIVE
Ketones, ur: NEGATIVE mg/dL
Leukocytes,Ua: NEGATIVE
Nitrite: NEGATIVE
Protein, ur: NEGATIVE mg/dL
Specific Gravity, Urine: 1.029 (ref 1.005–1.030)
pH: 5 (ref 5.0–8.0)

## 2023-02-02 LAB — COMPREHENSIVE METABOLIC PANEL
ALT: 36 U/L (ref 0–44)
AST: 25 U/L (ref 15–41)
Albumin: 3.6 g/dL (ref 3.5–5.0)
Alkaline Phosphatase: 97 U/L (ref 38–126)
Anion gap: 10 (ref 5–15)
BUN: 11 mg/dL (ref 6–20)
CO2: 25 mmol/L (ref 22–32)
Calcium: 8.8 mg/dL — ABNORMAL LOW (ref 8.9–10.3)
Chloride: 102 mmol/L (ref 98–111)
Creatinine, Ser: 1.04 mg/dL (ref 0.61–1.24)
GFR, Estimated: >60 mL/min (ref >60)
Glucose, Bld: 121 mg/dL — ABNORMAL HIGH (ref 70–99)
Potassium: 3.3 mmol/L — ABNORMAL LOW (ref 3.5–5.1)
Sodium: 137 mmol/L (ref 135–145)
Total Bilirubin: 0.5 mg/dL (ref 0.3–1.2)
Total Protein: 6.7 g/dL (ref 6.5–8.1)

## 2023-02-02 LAB — CBC WITH DIFFERENTIAL/PLATELET
Abs Immature Granulocytes: 0.03 10*3/uL (ref 0.00–0.07)
Basophils Absolute: 0.1 10*3/uL (ref 0.0–0.1)
Basophils Relative: 1 %
Eosinophils Absolute: 0.2 10*3/uL (ref 0.0–0.5)
Eosinophils Relative: 2 %
HCT: 40.2 % (ref 39.0–52.0)
Hemoglobin: 13.3 g/dL (ref 13.0–17.0)
Immature Granulocytes: 0 %
Lymphocytes Relative: 22 %
Lymphs Abs: 2.4 10*3/uL (ref 0.7–4.0)
MCH: 30.6 pg (ref 26.0–34.0)
MCHC: 33.1 g/dL (ref 30.0–36.0)
MCV: 92.6 fL (ref 80.0–100.0)
Monocytes Absolute: 1.2 10*3/uL — ABNORMAL HIGH (ref 0.1–1.0)
Monocytes Relative: 11 %
Neutro Abs: 7.3 10*3/uL (ref 1.7–7.7)
Neutrophils Relative %: 64 %
Platelets: 289 10*3/uL (ref 150–400)
RBC: 4.34 MIL/uL (ref 4.22–5.81)
RDW: 13.6 % (ref 11.5–15.5)
WBC: 11.3 10*3/uL — ABNORMAL HIGH (ref 4.0–10.5)
nRBC: 0 % (ref 0.0–0.2)

## 2023-02-02 LAB — LIPASE, BLOOD: Lipase: 53 U/L — ABNORMAL HIGH (ref 11–51)

## 2023-02-02 MED ORDER — IOHEXOL 350 MG/ML SOLN
100.0000 mL | Freq: Once | INTRAVENOUS | Status: AC | PRN
  Administered 2023-02-02: 100 mL via INTRAVENOUS

## 2023-02-02 NOTE — ED Notes (Signed)
Pt brought to ed rm 8 at this time, this RN now assuming care.

## 2023-02-02 NOTE — ED Triage Notes (Signed)
Pt presents to ER with c/o abd pain and diarrhea that started 2/21.  Pt states anytime he has eaten or drank anything, he started to have some abd cramping, and then diarrhea.  Pt states pain is generalized in his abd and non-radiating.  Pt endorses some nausea but denies vomiting.  Pt denies recent sick contacts.

## 2023-02-02 NOTE — ED Notes (Signed)
Patient transported to CT 

## 2023-02-02 NOTE — ED Provider Notes (Signed)
[facility] Provider Note    Event Date/Time   First MD Initiated Contact with Patient 02/02/23 2256     (approximate)   History   Abdominal Pain and Diarrhea   HPI  Jonathon Walters is a 42 y.o. male with no known chronic medical issues but who admits he does not go to the doctor and smokes tobacco daily.  He presents for evaluation of about 5 days of intermittent but persistent lower abdominal pain.  He said that it started with some nausea and diarrhea with lower abdominal pain after eating.  The nausea has gone away and has not been a problem since then, but he is continue to have loose and "chunky" stools.  He said that he does not have pain all the time, but it starts relatively quickly after he eats and is an aching generalized pain in the middle of his lower abdomen.  He has seen blood on the toilet paper a couple of times when he has wiped including once tonight, but he is not having a significant amount of blood passage from his rectum.  He is unaware of having any history of problems with his bowels or his stomach.  He denies fever, vomiting, upper abdominal pain, regular/excessive alcohol use.     Physical Exam   Triage Vital Signs: ED Triage Vitals  Enc Vitals Group     BP 02/02/23 2134 (!) 154/94     Pulse Rate 02/02/23 2134 98     Resp 02/02/23 2134 18     Temp 02/02/23 2134 98.7 F (37.1 C)     Temp Source 02/02/23 2134 Oral     SpO2 02/02/23 2134 97 %     Weight 02/02/23 2133 104.3 kg (230 lb)     Height 02/02/23 2133 1.778 m ('5\' 10"'$ )     Head Circumference --      Peak Flow --      Pain Score --      Pain Loc --      Pain Edu? --      Excl. in [place]? --     Most recent vital signs: Vitals:   02/02/23 2254 02/03/23 0115  BP: 121/85 (!) 150/82  Pulse: 83 87  Resp: 20 20  Temp:    SpO2: 99% 100%     General: Awake, no distress.  Very poor dentition chronically. CV:  Good peripheral perfusion.  Regular rate and  rhythm Resp:  Normal effort. Speaking easily and comfortably, no accessory muscle usage nor intercostal retractions.   Abd:  Mild tenderness to palpation in the suprapubic and infraumbilical region.  No distention.  No upper abdominal tenderness to palpation with negative Murphy sign.  No rebound or guarding.     ED Results / Procedures / Treatments   Labs (all labs ordered are listed, but only abnormal results are displayed) Labs Reviewed  CBC WITH DIFFERENTIAL/PLATELET - Abnormal; Notable for the following components:      Result Value   WBC 11.3 (*)    Monocytes Absolute 1.2 (*)    All other components within normal limits  COMPREHENSIVE METABOLIC PANEL - Abnormal; Notable for the following components:   Potassium 3.3 (*)    Glucose, Bld 121 (*)    Calcium 8.8 (*)    All other components within normal limits  LIPASE, BLOOD - Abnormal; Notable for the following components:   Lipase 53 (*)    All other components within normal limits  URINALYSIS, ROUTINE W  REFLEX MICROSCOPIC - Abnormal; Notable for the following components:   Color, Urine YELLOW (*)    APPearance HAZY (*)    All other components within normal limits     RADIOLOGY I viewed and interpreted the patient's CT abdomen/pelvis.  See hospital course for details: Colitis, no evidence of diverticulitis or other emergent condition.    PROCEDURES:  Critical Care performed: No  Procedures   MEDICATIONS ORDERED IN ED: Medications  iohexol (OMNIPAQUE) 350 MG/ML injection 100 mL (100 mLs Intravenous Contrast Given 02/02/23 2332)     IMPRESSION / MDM / ASSESSMENT AND PLAN / ED COURSE  I reviewed the triage vital signs and the nursing notes.                              Differential diagnosis includes, but is not limited to, diverticulitis, acid reflux/GERD, pancreatitis, biliary colic, appendicitis, urinary tract infection, less likely renal/ureteral colic.  Patient's presentation is most consistent with acute  presentation with potential threat to life or bodily function.  Labs/studies ordered: Comprehensive metabolic panel, CBC with differential, lipase, urinalysis, CT of the abdomen/pelvis with IV contrast.  Vital signs are stable and within normal limits.  Patient generally has a reassuring abdominal exam but he does have tenderness to palpation of the middle lower abdomen.  His symptoms are most suggestive of diverticulitis.  The postprandial symptoms would suggest biliary colic, but he has no upper abdominal or right upper quadrant tenderness to palpation and I think it is unlikely based on the physical exam.  Lab work is notable for mild hypokalemia which is not clinically significant and a very slight elevation of his lipase at 53 which I doubt represents acute pancreatitis but we should be able to look for radiographic evidence on the CT scan.  His urinalysis is normal.  He has a very mildly leukocytosis of 11.3 which again is unlikely to be clinically significant.   Clinical Course as of 02/03/23 0131  Mon Feb 03, 2023  0128 I viewed and interpreted the patient's CT of the abdomen and pelvis.  I did not see any evidence of diverticulitis, SBO/ileus, or other emergent condition.  The radiologist identified some areas of colitis.   [CF]  0128 I reassessed the patient.  He is feeling okay, still occasional abdominal pain but none currently.  I talked with him about how most of the time we do not treat colitis with antibiotics and that antibiotics (including Cipro) can have side effects.  He would rather wait and not treat with antibiotics at this time and his wife agrees.  I encouraged him to establish a primary care doctor and gave him the primary care resource guide to follow-up.  I given strict return precautions if he develops worsening pain and he understands and agrees with the plan. [CF]    Clinical Course User Index [CF] Hinda Kehr, MD     FINAL CLINICAL IMPRESSION(S) / ED DIAGNOSES    Final diagnoses:  Colitis  Lower abdominal pain     Rx / DC Orders   ED Discharge Orders     None        Note:  This document was prepared using Dragon voice recognition software and may include unintentional dictation errors.   Hinda Kehr, MD 02/03/23 [phone]

## 2023-02-02 NOTE — ED Notes (Signed)
ED Provider at bedside. 

## 2023-02-03 NOTE — ED Notes (Signed)
ED Provider at bedside. 

## 2023-02-03 NOTE — Discharge Instructions (Addendum)
As we discussed, you have some inflammation or infection in your large intestine.  The condition is called colitis and it usually resolves on its own.  Please read through the included information.  Return to the emergency department if you develop new or worsening symptoms that concern you.
